# Patient Record
Sex: Female | Born: 1955 | Race: White | Hispanic: No | Marital: Single | Smoking: Former smoker
Health system: Southern US, Community
[De-identification: ages and names within clinical notes are randomized; demographics above are authoritative.]

## PROBLEM LIST (undated history)

## (undated) DIAGNOSIS — K859 Acute pancreatitis without necrosis or infection, unspecified: Secondary | ICD-10-CM

## (undated) DIAGNOSIS — I10 Essential (primary) hypertension: Secondary | ICD-10-CM

## (undated) DIAGNOSIS — M51369 Other intervertebral disc degeneration, lumbar region without mention of lumbar back pain or lower extremity pain: Secondary | ICD-10-CM

## (undated) DIAGNOSIS — M5136 Other intervertebral disc degeneration, lumbar region: Secondary | ICD-10-CM

## (undated) HISTORY — PX: CHOLECYSTECTOMY: SHX55

---

## 2004-09-18 ENCOUNTER — Ambulatory Visit: Payer: Self-pay | Admitting: Family Medicine

## 2004-12-12 ENCOUNTER — Emergency Department: Payer: Self-pay | Admitting: Emergency Medicine

## 2005-05-05 ENCOUNTER — Emergency Department: Payer: Self-pay | Admitting: Emergency Medicine

## 2006-01-10 ENCOUNTER — Emergency Department: Payer: Self-pay | Admitting: Emergency Medicine

## 2007-06-07 ENCOUNTER — Ambulatory Visit: Payer: Self-pay | Admitting: Internal Medicine

## 2007-06-09 ENCOUNTER — Emergency Department: Payer: Self-pay | Admitting: Emergency Medicine

## 2007-06-16 ENCOUNTER — Other Ambulatory Visit: Payer: Self-pay

## 2007-06-16 ENCOUNTER — Ambulatory Visit: Payer: Self-pay | Admitting: Emergency Medicine

## 2007-06-16 ENCOUNTER — Emergency Department: Payer: Self-pay | Admitting: Emergency Medicine

## 2007-07-16 ENCOUNTER — Ambulatory Visit: Payer: Self-pay | Admitting: Vascular Surgery

## 2007-12-31 ENCOUNTER — Emergency Department: Payer: Self-pay | Admitting: Emergency Medicine

## 2007-12-31 ENCOUNTER — Ambulatory Visit: Payer: Self-pay | Admitting: Family Medicine

## 2008-01-01 ENCOUNTER — Inpatient Hospital Stay: Payer: Self-pay | Admitting: Internal Medicine

## 2008-01-13 ENCOUNTER — Encounter: Payer: Self-pay | Admitting: Orthopaedic Surgery

## 2010-07-12 ENCOUNTER — Emergency Department: Payer: Self-pay | Admitting: Emergency Medicine

## 2011-10-18 ENCOUNTER — Emergency Department: Payer: Self-pay | Admitting: Internal Medicine

## 2012-06-16 IMAGING — CT CT HEAD WITHOUT CONTRAST
2 series · 16 of 30 positions shown, 20 images · non-contrast
Comparison: none

REASON FOR EXAM: seizure
COMMENTS:

PROCEDURE:     CT  - CT HEAD WITHOUT CONTRAST  - October 18, 2011  [DATE]
RESULT:     Comparison:  None
TECHNIQUE: Multiple axial images from the foramen magnum to the vertex were
obtained without IV contrast.

[Series 2: without · axial · non-contrast · 0.41mm/px · z∈[-219,-99]mm · 13 of 29 slices shown, 17 images]
[im 3/29  brain]
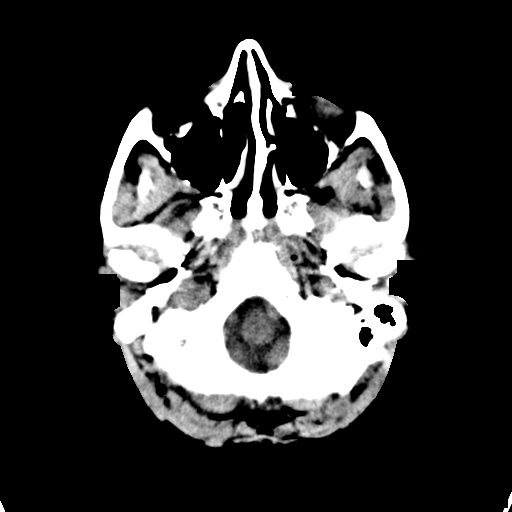
[im 3/29  bone]
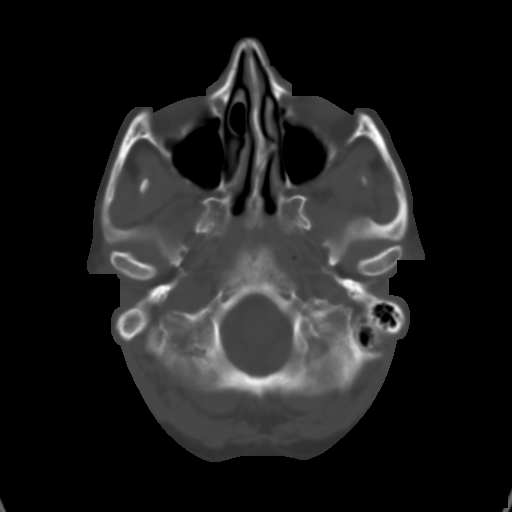
[im 5/29  brain]
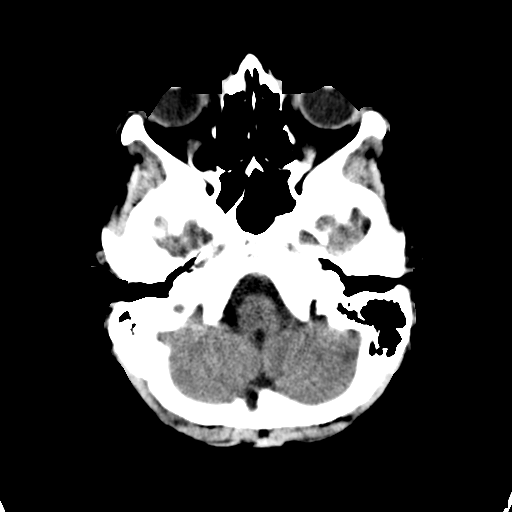
[im 7/29  brain]
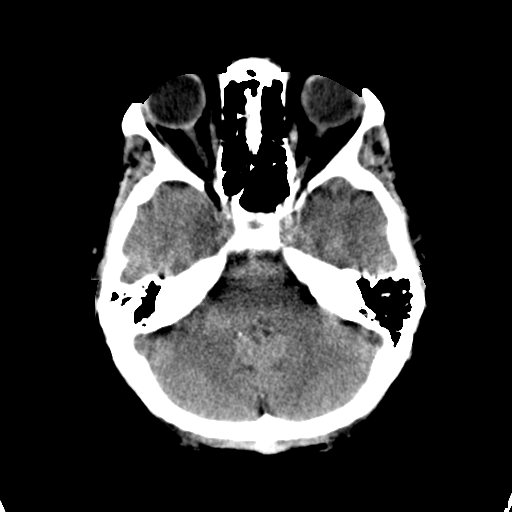
[im 9/29  brain]
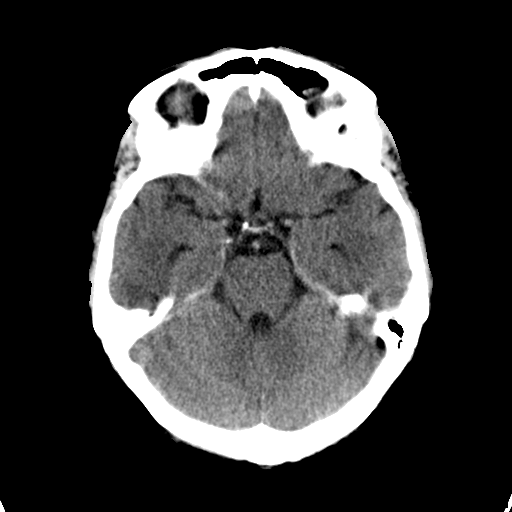
[im 11/29  brain]
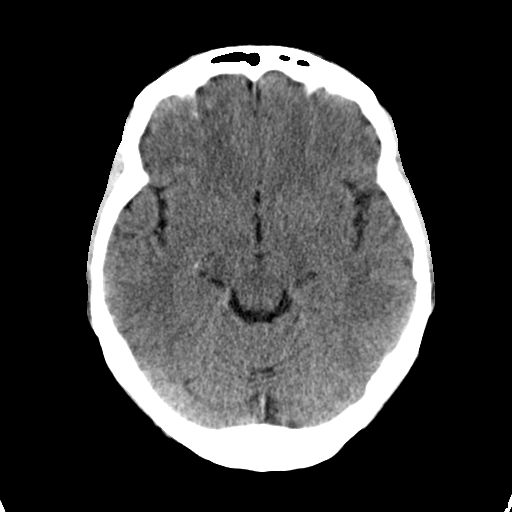
[im 11/29  bone]
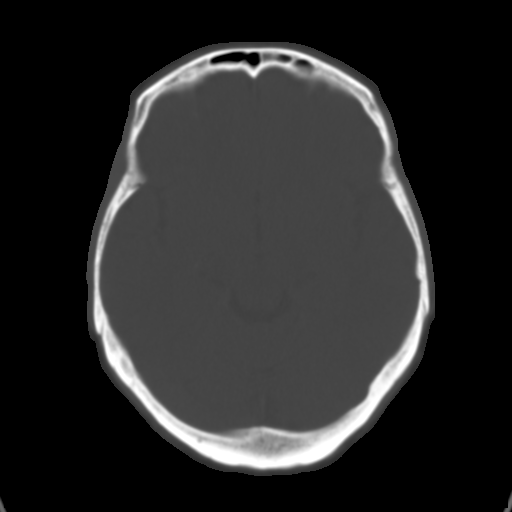
[im 13/29  brain]
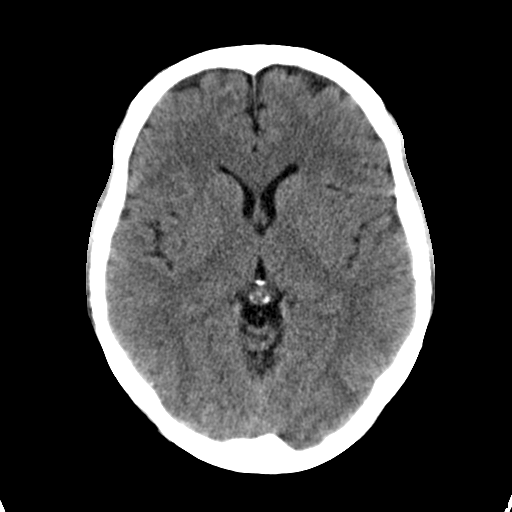
[im 15/29  brain]
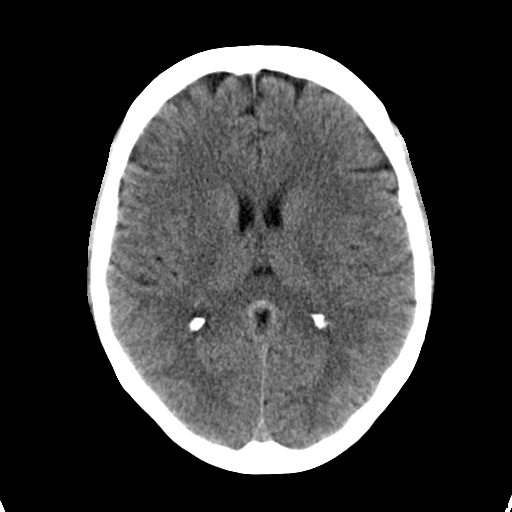
[im 17/29  brain]
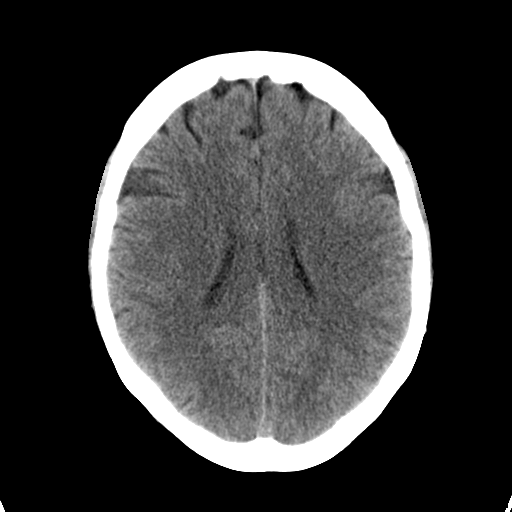
[im 19/29  brain]
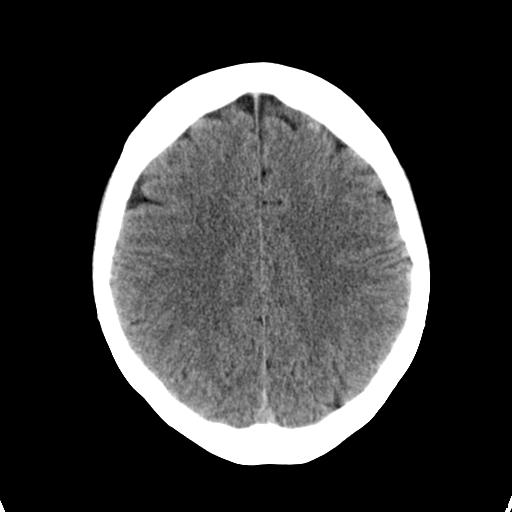
[im 19/29  bone]
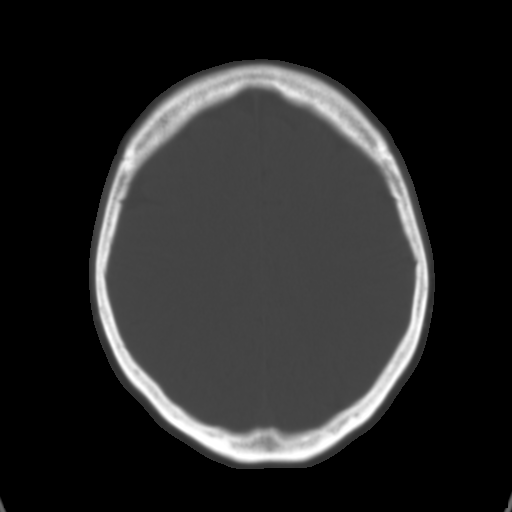
[im 21/29  brain]
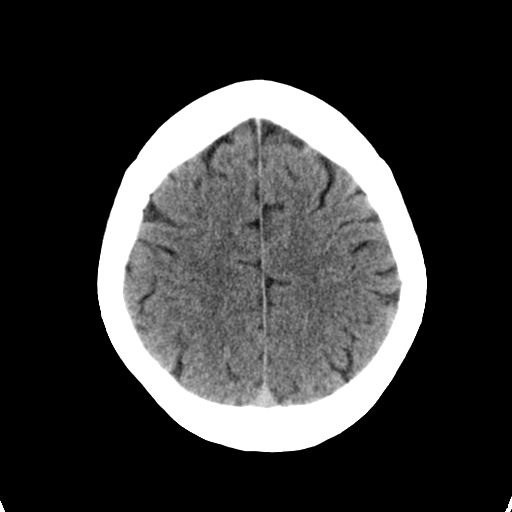
[im 23/29  brain]
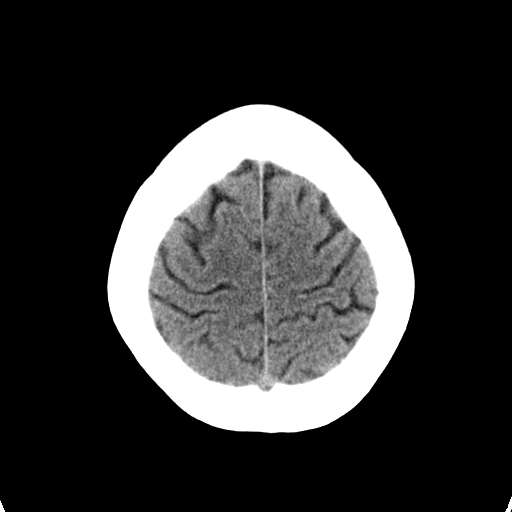
[im 25/29  brain]
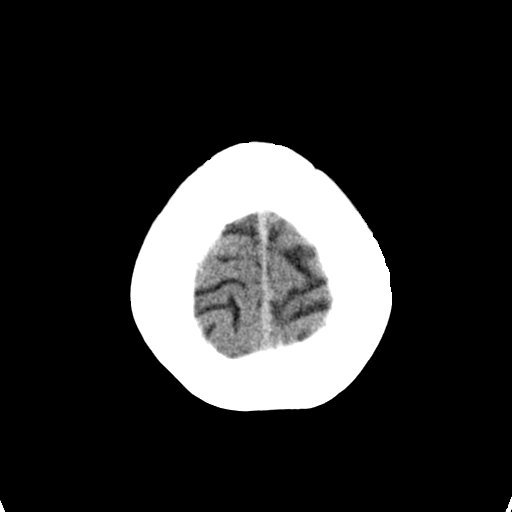
[im 27/29  brain]
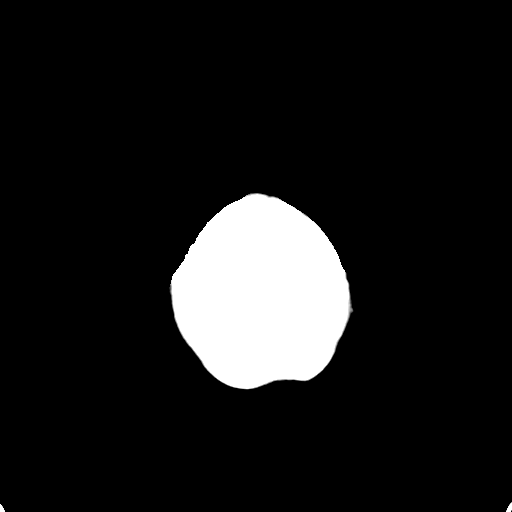
[im 27/29  bone]
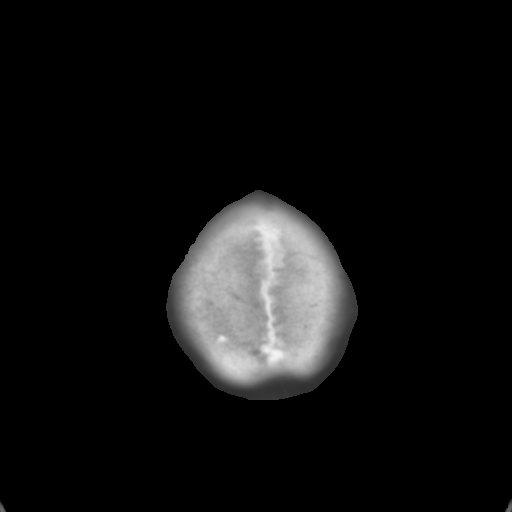

[Series 3: bone · axial · 0.41mm/px · z∈[-219,-179]mm · 3 of 29 slices shown]
[im 3/29  bone]
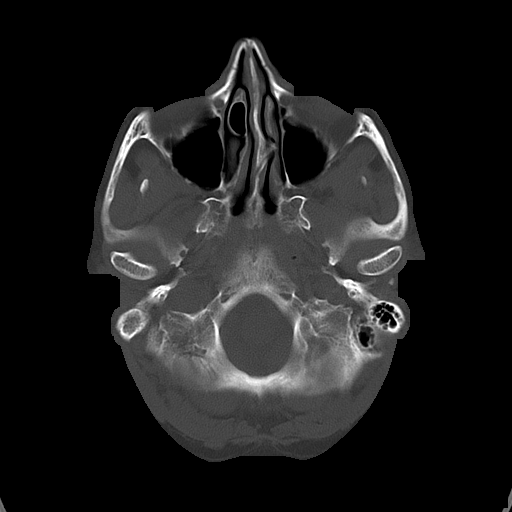
[im 7/29  bone]
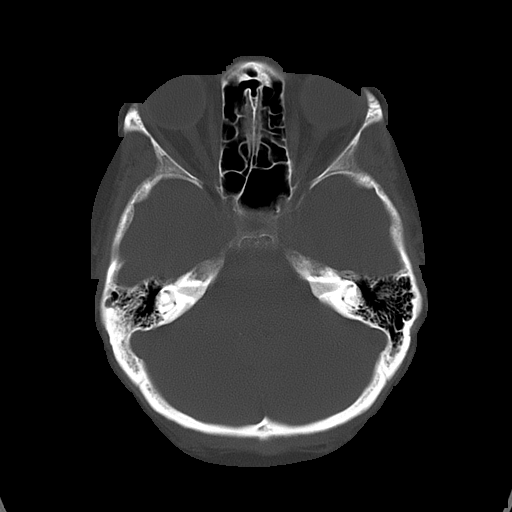
[im 11/29  bone]
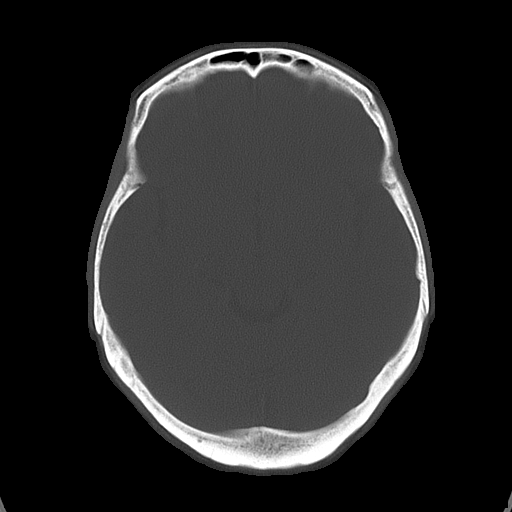

[16 of 30 positions shown; findings below may reference images not displayed]

FINDINGS: There is no evidence for mass effect, midline shift, or extra-axial fluid
collections. There is no evidence for space-occupying lesion, intracranial
hemorrhage, or cortical-based area of infarction.

There is a trace amount of fluid in the right mastoid air cells, which is
nonspecific.

The osseous structures are unremarkable.
IMPRESSION: No acute intracranial process.

## 2018-06-05 ENCOUNTER — Other Ambulatory Visit: Payer: Self-pay

## 2018-06-05 ENCOUNTER — Emergency Department
Admission: EM | Admit: 2018-06-05 | Discharge: 2018-06-05 | Disposition: A | Discharge status: Home / Self Care | Payer: Self-pay | Attending: Emergency Medicine | Admitting: Emergency Medicine

## 2018-06-05 ENCOUNTER — Encounter: Payer: Self-pay | Admitting: Intensive Care

## 2018-06-05 ENCOUNTER — Emergency Department: Payer: Self-pay

## 2018-06-05 DIAGNOSIS — I10 Essential (primary) hypertension: Secondary | ICD-10-CM | POA: Insufficient documentation

## 2018-06-05 DIAGNOSIS — R1010 Upper abdominal pain, unspecified: Secondary | ICD-10-CM

## 2018-06-05 DIAGNOSIS — Z87891 Personal history of nicotine dependence: Secondary | ICD-10-CM | POA: Insufficient documentation

## 2018-06-05 DIAGNOSIS — K5289 Other specified noninfective gastroenteritis and colitis: Secondary | ICD-10-CM | POA: Insufficient documentation

## 2018-06-05 DIAGNOSIS — Z760 Encounter for issue of repeat prescription: Secondary | ICD-10-CM | POA: Insufficient documentation

## 2018-06-05 DIAGNOSIS — K529 Noninfective gastroenteritis and colitis, unspecified: Secondary | ICD-10-CM

## 2018-06-05 HISTORY — DX: Other intervertebral disc degeneration, lumbar region: M51.36

## 2018-06-05 HISTORY — DX: Other intervertebral disc degeneration, lumbar region without mention of lumbar back pain or lower extremity pain: M51.369

## 2018-06-05 HISTORY — DX: Acute pancreatitis without necrosis or infection, unspecified: K85.90

## 2018-06-05 HISTORY — DX: Essential (primary) hypertension: I10

## 2018-06-05 LAB — BASIC METABOLIC PANEL
Anion gap: 9 (ref 5–15)
BUN: 11 mg/dL (ref 6–20)
CO2: 24 mmol/L (ref 22–32)
Calcium: 9.4 mg/dL (ref 8.9–10.3)
Chloride: 109 mmol/L (ref 98–111)
Creatinine, Ser: 0.67 mg/dL (ref 0.44–1.00)
GFR calc Af Amer: >60 mL/min (ref >60)
GLUCOSE: 117 mg/dL — AB (ref 70–99)
POTASSIUM: 3.5 mmol/L (ref 3.5–5.1)
SODIUM: 142 mmol/L (ref 135–145)

## 2018-06-05 LAB — HEPATIC FUNCTION PANEL
ALT: 18 U/L (ref 0–44)
AST: 23 U/L (ref 15–41)
Albumin: 4.6 g/dL (ref 3.5–5.0)
Alkaline Phosphatase: 94 U/L (ref 38–126)
Bilirubin, Direct: <0.1 mg/dL (ref 0.0–0.2)
TOTAL PROTEIN: 8.1 g/dL (ref 6.5–8.1)
Total Bilirubin: 0.9 mg/dL (ref 0.3–1.2)

## 2018-06-05 LAB — CBC
HEMATOCRIT: 43.9 % (ref 35.0–47.0)
Hemoglobin: 14.8 g/dL (ref 12.0–16.0)
MCH: 30.4 pg (ref 26.0–34.0)
MCHC: 33.7 g/dL (ref 32.0–36.0)
MCV: 90.2 fL (ref 80.0–100.0)
Platelets: 420 10*3/uL (ref 150–440)
RBC: 4.87 MIL/uL (ref 3.80–5.20)
RDW: 14.2 % (ref 11.5–14.5)
WBC: 13.2 10*3/uL — AB (ref 3.6–11.0)

## 2018-06-05 LAB — LIPASE, BLOOD: LIPASE: 24 U/L (ref 11–51)

## 2018-06-05 LAB — TROPONIN I: Troponin I: <0.03 ng/mL (ref <0.03)

## 2018-06-05 MED ORDER — ONDANSETRON 4 MG PO TBDP: 4.0000 mg | ORAL_TABLET | Freq: Three times a day (TID) | ORAL | 0 refills | Status: DC | PRN

## 2018-06-05 MED ORDER — ONDANSETRON 4 MG PO TBDP
4.0000 mg | ORAL_TABLET | Freq: Once | ORAL | Status: AC
  Administered 2018-06-05: 4 mg via ORAL
  Filled 2018-06-05: qty 1

## 2018-06-05 MED ORDER — AMLODIPINE BESYLATE 5 MG PO TABS: 5.0000 mg | ORAL_TABLET | Freq: Every day | ORAL | 11 refills | Status: DC

## 2018-06-05 MED ORDER — POTASSIUM CHLORIDE ER 10 MEQ PO TBCR: 10.0000 meq | EXTENDED_RELEASE_TABLET | Freq: Every day | ORAL | 1 refills | Status: DC

## 2018-06-05 MED ORDER — CYCLOBENZAPRINE HCL 10 MG PO TABS: 10.0000 mg | ORAL_TABLET | Freq: Three times a day (TID) | ORAL | 1 refills | Status: DC | PRN

## 2018-06-05 MED ORDER — OXYCODONE-ACETAMINOPHEN 5-325 MG PO TABS
2.0000 | ORAL_TABLET | Freq: Once | ORAL | Status: AC
  Administered 2018-06-05: 2 via ORAL
  Filled 2018-06-05: qty 2

## 2018-06-05 MED ORDER — FUROSEMIDE 20 MG PO TABS: 20.0000 mg | ORAL_TABLET | ORAL | 0 refills | Status: DC | PRN

## 2018-06-05 MED ORDER — PANTOPRAZOLE SODIUM 40 MG PO TBEC: 40.0000 mg | DELAYED_RELEASE_TABLET | Freq: Every day | ORAL | 1 refills | Status: DC

## 2018-06-05 NOTE — ED Triage Notes (Signed)
Pt in via EMS with c/o HTN. EMS reports pt has been off of her meds for 30 days. EMS reports pt also c/o pain to mid epigastric and describes it as the same as when she had pancreatitis.

## 2018-06-05 NOTE — ED Notes (Signed)
Pt reports that she is having upper abd pain - pain started 2-3 years ago - she wants to find out today "what is going on" - pt states that she has nausea and that when she is cold the pain is worse - she states once before she had pancreatitis and she wants worked up for that  Pt reports that she has been dx with HTN since 2003 and today it is worse because she went to her PCP in Feb. And her MD retired - the new provider would not prescribe her all of her medication and since she has not returned for follow up - she has been without medication for 30 days

## 2018-06-05 NOTE — ED Provider Notes (Signed)
[facility] Emergency Department Provider Note       Time seen: ----------------------------------------- 4:02 PM on 06/05/2018 -----------------------------------------   I have reviewed the triage vital signs and the nursing notes.  HISTORY   Chief Complaint Hypertension and Abdominal Pain    HPI Pamela Hanson is a 62 y.o. female with a history of hypertension and pancreatitis who presents to the ED for abdominal pain.  Patient reports having upper abdominal pain that started 2 to 3 years ago.  Acutely she has had vomiting and diarrhea over the past several days.  She wants to find out today was going on.  She states she has had nausea and when she is cold the pain is worse.  She also has run out of all of her medications because her doctor retired she is requesting a medication refill for her blood pressure.  Past Medical History:  Diagnosis Date  . Degenerative disc disease, lumbar   . Hypertension   . Pancreatitis     There are no active problems to display for this patient.   History reviewed. No pertinent surgical history.  Allergies Patient has no known allergies.  Social History Social History   Tobacco Use  . Smoking status: Former Smoker    Types: Cigarettes  . Smokeless tobacco: Never Used  Substance Use Topics  . Alcohol use: Yes    Comment: occ  . Drug use: Yes    Types: Marijuana   Review of Systems Constitutional: Negative for fever. Cardiovascular: Negative for chest pain. Respiratory: Negative for shortness of breath. Gastrointestinal: Positive for abdominal pain Genitourinary: Negative for dysuria. Musculoskeletal: Negative for back pain. Skin: Negative for rash. Neurological: Negative for headaches, focal weakness or numbness.  All systems negative/normal/unremarkable except as stated in the HPI  ____________________________________________   PHYSICAL EXAM:  VITAL SIGNS: ED Triage Vitals  Enc Vitals  Group     BP 06/05/18 1323 (!) 185/84     Pulse Rate 06/05/18 1323 87     Resp 06/05/18 1323 18     Temp 06/05/18 1323 99.3 F (37.4 C)     Temp Source 06/05/18 1323 Oral     SpO2 06/05/18 1323 94 %     Weight 06/05/18 1325 185 lb 10 oz (84.2 kg)     Height 06/05/18 1325 4' 11.5" (1.511 m)     Head Circumference --      Peak Flow --      Pain Score 06/05/18 1325 10     Pain Loc --      Pain Edu? --      Excl. in GC? --    Constitutional: Alert and oriented. Well appearing and in no distress. Eyes: Conjunctivae are normal. Normal extraocular movements. ENT   Head: Normocephalic and atraumatic.   Nose: No congestion/rhinnorhea.   Mouth/Throat: Mucous membranes are moist.   Neck: No stridor. Cardiovascular: Normal rate, regular rhythm. No murmurs, rubs, or gallops. Respiratory: Normal respiratory effort without tachypnea nor retractions. Breath sounds are clear and equal bilaterally. No wheezes/rales/rhonchi. Gastrointestinal: Epigastric tenderness, no rebound or guarding.  Normal bowel sounds. Musculoskeletal: Nontender with normal range of motion in extremities. No lower extremity tenderness nor edema. Neurologic:  Normal speech and language. No gross focal neurologic deficits are appreciated.  Skin:  Skin is warm, dry and intact. No rash noted. Psychiatric: Mood and affect are normal. Speech and behavior are normal.  ____________________________________________  EKG: Interpreted by me.  Sinus rhythm rate 73 bpm, normal PR interval,  normal QRS, normal QT  ____________________________________________  ED COURSE:  As part of my medical decision making, I reviewed the following data within the electronic MEDICAL RECORD NUMBER History obtained from family if available, nursing notes, old chart and ekg, as well as notes from prior ED visits. Patient presented for upper abdominal pain, we will assess with labs and imaging as indicated at this time.    Procedures ____________________________________________   LABS (pertinent positives/negatives)  Labs Reviewed  BASIC METABOLIC PANEL - Abnormal; Notable for the following components:      Result Value   Glucose, Bld 117 (*)    All other components within normal limits  CBC - Abnormal; Notable for the following components:   WBC 13.2 (*)    All other components within normal limits  TROPONIN I  LIPASE, BLOOD  HEPATIC FUNCTION PANEL  POC URINE PREG, ED    RADIOLOGY Images were viewed by me  Abdomen 2 view was unremarkable  ____________________________________________  DIFFERENTIAL DIAGNOSIS   GERD, peptic ulcer disease, constipation, gas pain, gastroenteritis  FINAL ASSESSMENT AND PLAN  Gastroenteritis, prescription refill   Plan: The patient had presented for acute on chronic pain with recent vomiting and diarrhea. Patient's labs did reveal a leukocytosis of uncertain significance. Patient's imaging was negative.  Should be discharged with antiemetics as well as antacids and blood pressure medication refills.  She is cleared for outpatient follow-up.   Ulice Dash, MD   Note: This note was generated in part or whole with voice recognition software. Voice recognition is usually quite accurate but there are transcription errors that can and very often do occur. I apologize for any typographical errors that were not detected and corrected.     Emily Filbert, MD 06/05/18 [phone]

## 2018-06-05 NOTE — ED Triage Notes (Signed)
Patient states "I have been out of b/p X30 days with headache and nausea. I also am having upper abdominal pain" HX pancreatitis. Ambulatory with no problems

## 2019-02-02 IMAGING — CR DG ABDOMEN 2V
1 series · 3 of 3 positions shown · non-contrast
Comparison: None.

CLINICAL DATA: Pt states she has had nausea and abdominal pain
right under her breasts. Hx HTN, cholecystectomy

EXAM:
ABDOMEN - 2 VIEW

[Series 3: t abdomen supine · 0.14mm/px · 3 of 3 slices shown]
[im 1/3]
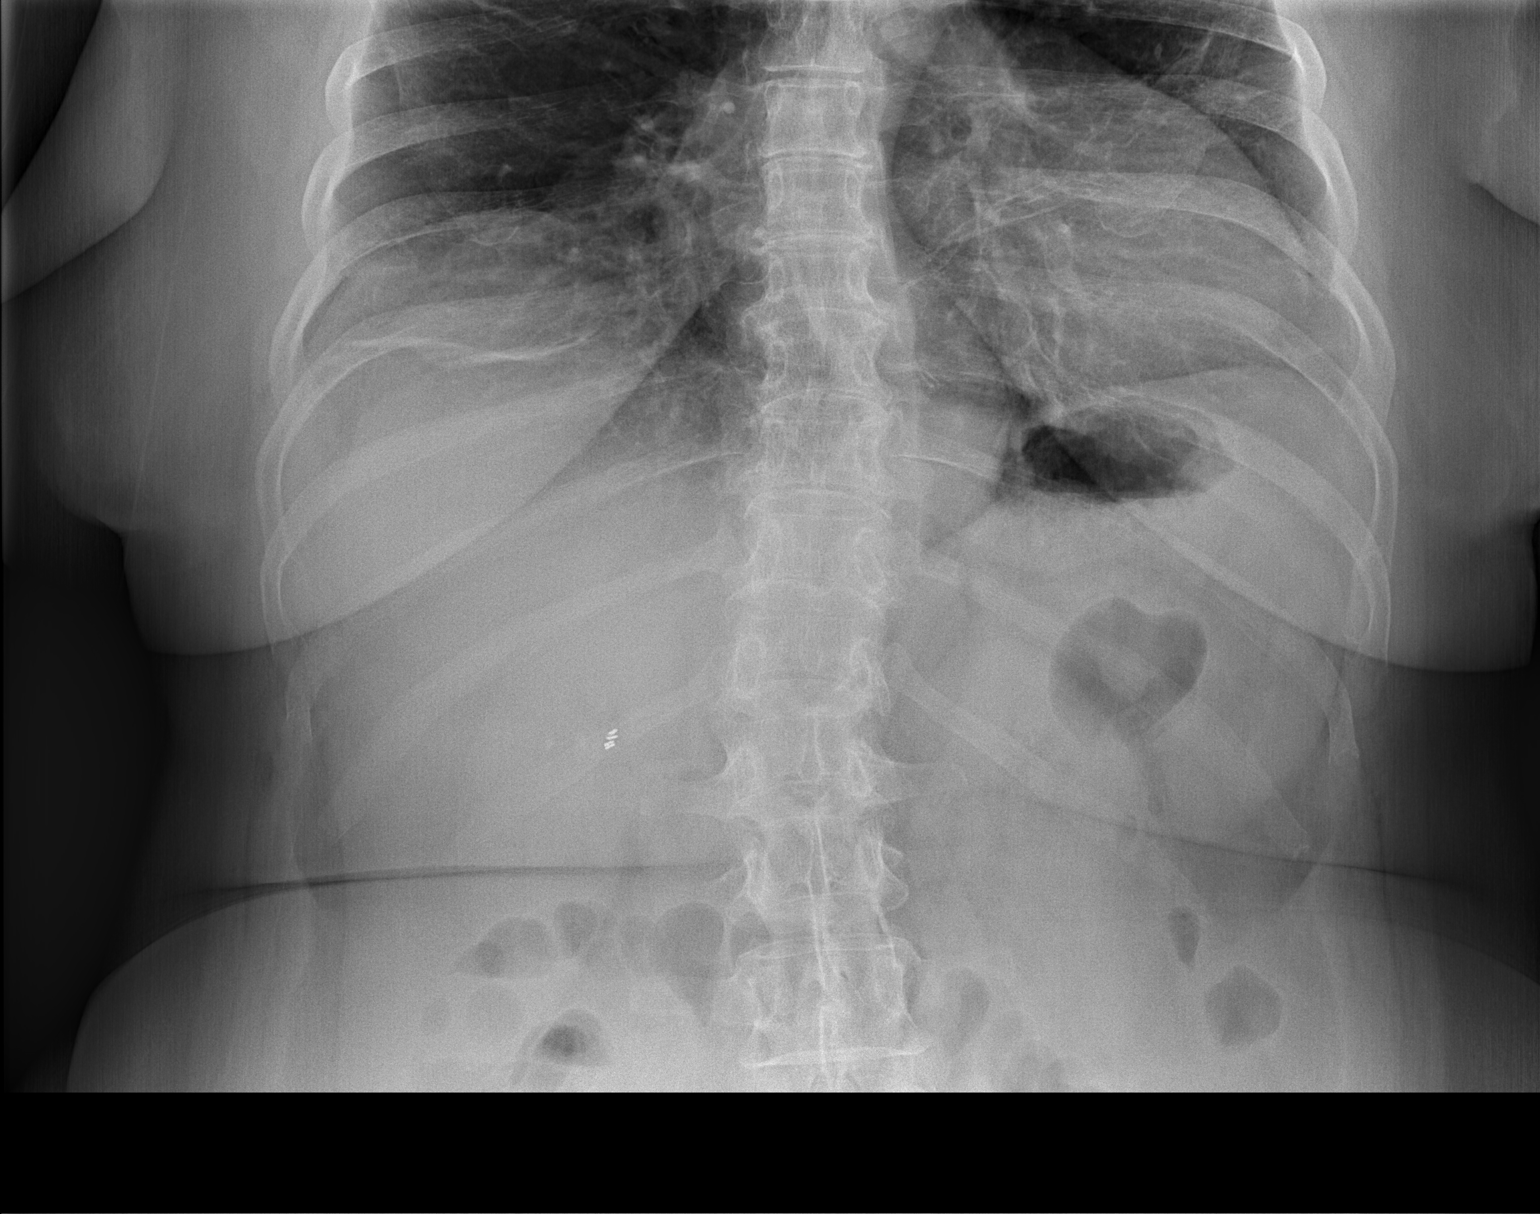
[im 2/3]
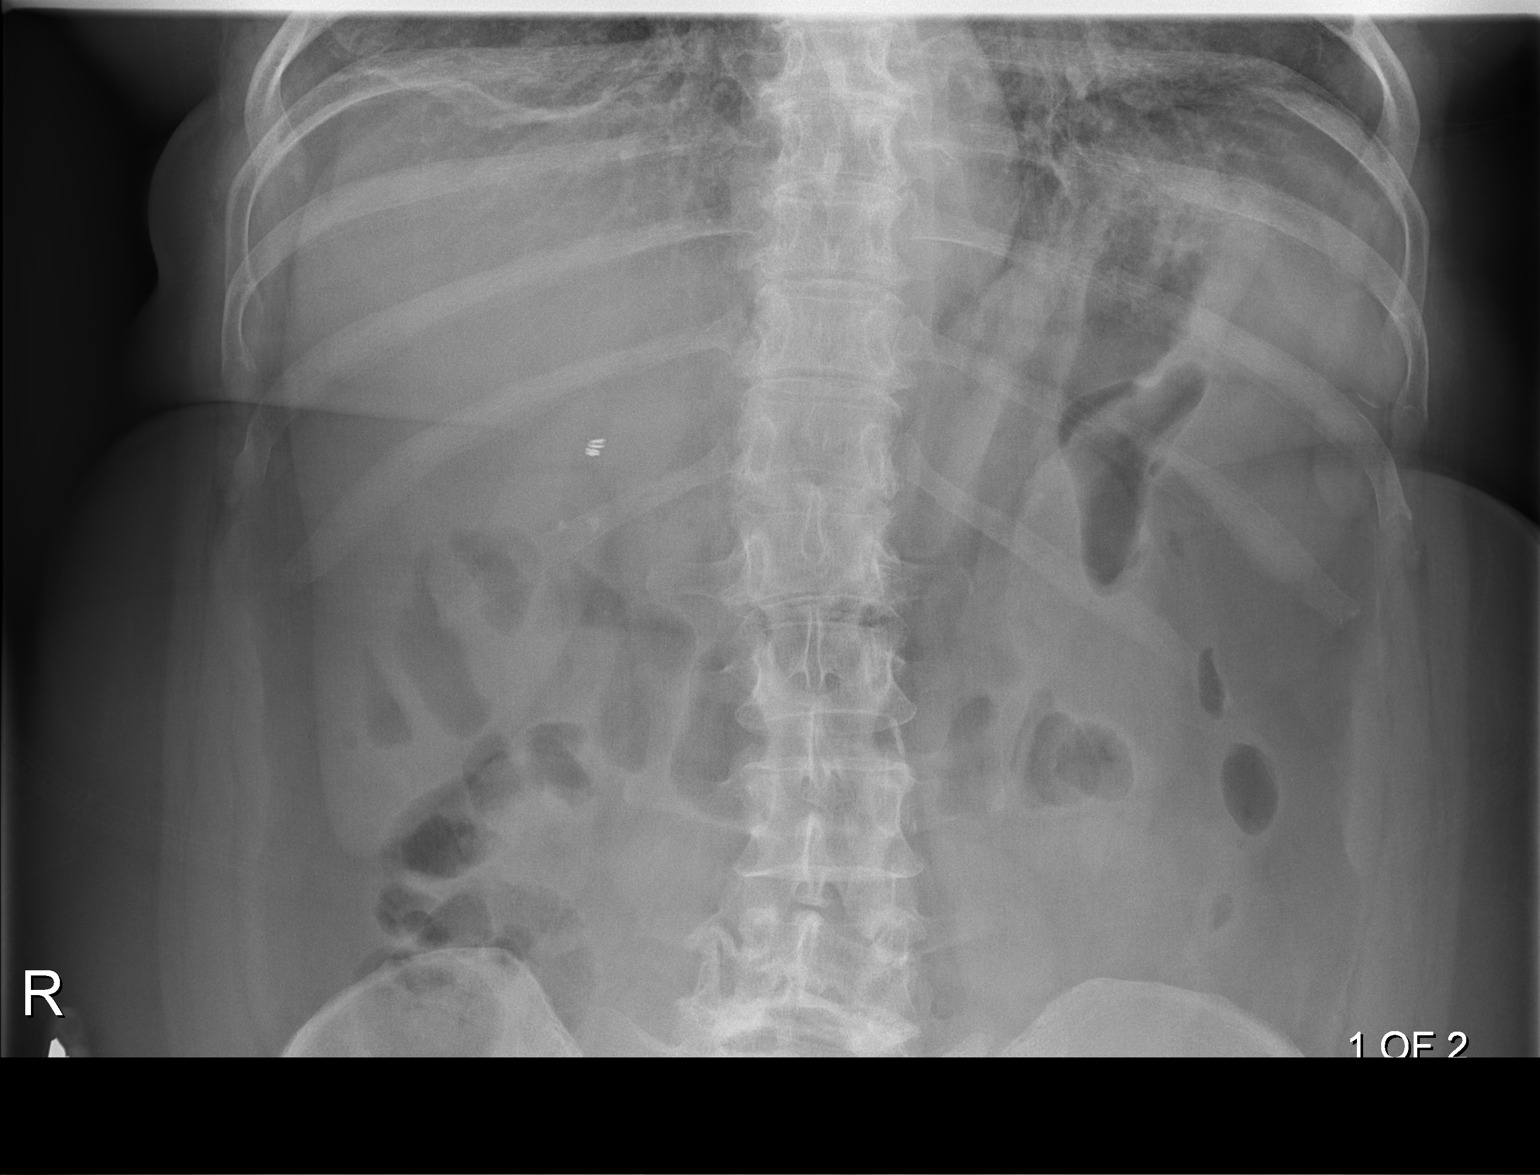
[im 3/3]
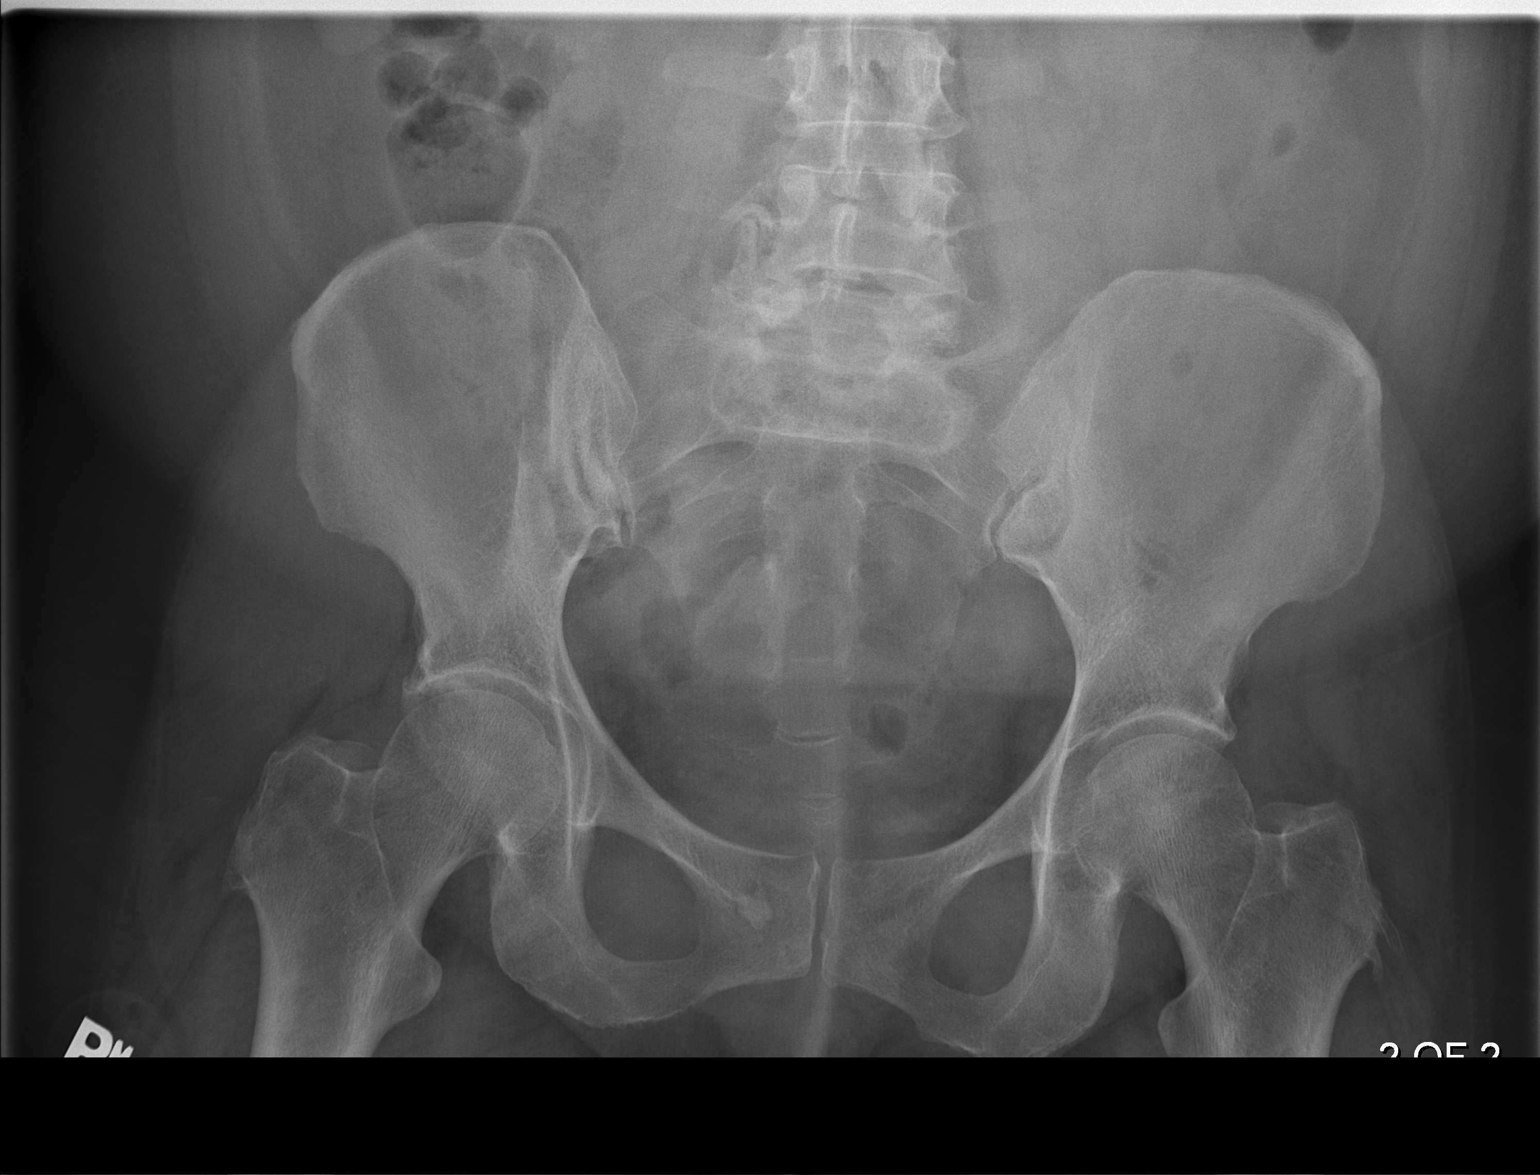

[3 of 3 positions shown; findings below may reference images not displayed]

FINDINGS: Bowel gas pattern is nonobstructive. No abnormal calcifications or
organomegaly. Surgical clips are present the RIGHT UPPER QUADRANT
the abdomen. No acute osseous abnormality.
IMPRESSION: No evidence for acute  abnormality.

## 2019-02-27 ENCOUNTER — Encounter: Payer: Self-pay | Admitting: Family Medicine

## 2019-02-27 ENCOUNTER — Ambulatory Visit: Payer: Self-pay | Admitting: Family Medicine

## 2019-02-27 ENCOUNTER — Other Ambulatory Visit: Payer: Self-pay

## 2019-02-27 VITALS — BP 148/84 | HR 93 | Temp 98.6°F | Resp 14 | Ht 59.0 in | Wt 174.4 lb

## 2019-02-27 DIAGNOSIS — Z599 Problem related to housing and economic circumstances, unspecified: Secondary | ICD-10-CM

## 2019-02-27 DIAGNOSIS — G8929 Other chronic pain: Secondary | ICD-10-CM

## 2019-02-27 DIAGNOSIS — M545 Low back pain, unspecified: Secondary | ICD-10-CM | POA: Insufficient documentation

## 2019-02-27 DIAGNOSIS — E6609 Other obesity due to excess calories: Secondary | ICD-10-CM | POA: Insufficient documentation

## 2019-02-27 DIAGNOSIS — Z13220 Encounter for screening for lipoid disorders: Secondary | ICD-10-CM

## 2019-02-27 DIAGNOSIS — Z598 Other problems related to housing and economic circumstances: Secondary | ICD-10-CM

## 2019-02-27 DIAGNOSIS — Z8639 Personal history of other endocrine, nutritional and metabolic disease: Secondary | ICD-10-CM | POA: Insufficient documentation

## 2019-02-27 DIAGNOSIS — I10 Essential (primary) hypertension: Secondary | ICD-10-CM

## 2019-02-27 DIAGNOSIS — Z6835 Body mass index (BMI) 35.0-35.9, adult: Secondary | ICD-10-CM

## 2019-02-27 MED ORDER — ETODOLAC 500 MG PO TABS: 500.0000 mg | ORAL_TABLET | Freq: Two times a day (BID) | ORAL | 2 refills | Status: DC | PRN

## 2019-02-27 MED ORDER — FUROSEMIDE 20 MG PO TABS: 20.0000 mg | ORAL_TABLET | ORAL | 0 refills | Status: DC | PRN

## 2019-02-27 MED ORDER — AMLODIPINE BESYLATE 10 MG PO TABS: 10.0000 mg | ORAL_TABLET | Freq: Every day | ORAL | 3 refills | Status: DC

## 2019-02-27 MED ORDER — CYCLOBENZAPRINE HCL 10 MG PO TABS: 10.0000 mg | ORAL_TABLET | Freq: Every day | ORAL | 2 refills | Status: DC | PRN

## 2019-02-27 MED ORDER — POTASSIUM CHLORIDE ER 10 MEQ PO TBCR: 10.0000 meq | EXTENDED_RELEASE_TABLET | Freq: Every day | ORAL | 1 refills | Status: DC

## 2019-02-27 NOTE — Patient Instructions (Signed)
DASH Eating Plan DASH stands for "Dietary Approaches to Stop Hypertension." The DASH eating plan is a healthy eating plan that has been shown to reduce high blood pressure (hypertension). It may also reduce your risk for type 2 diabetes, heart disease, and stroke. The DASH eating plan may also help with weight loss. What are tips for following this plan?  General guidelines  Avoid eating more than 2,300 mg (milligrams) of salt (sodium) a day. If you have hypertension, you may need to reduce your sodium intake to 1,500 mg a day.  Limit alcohol intake to no more than 1 drink a day for nonpregnant women and 2 drinks a day for men. One drink equals 12 oz of beer, 5 oz of wine, or 1 oz of hard liquor.  Work with your health care provider to maintain a healthy body weight or to lose weight. Ask what an ideal weight is for you.  Get at least 30 minutes of exercise that causes your heart to beat faster (aerobic exercise) most days of the week. Activities may include walking, swimming, or biking.  Work with your health care provider or diet and nutrition specialist (dietitian) to adjust your eating plan to your individual calorie needs. Reading food labels   Check food labels for the amount of sodium per serving. Choose foods with less than 5 percent of the Daily Value of sodium. Generally, foods with less than 300 mg of sodium per serving fit into this eating plan.  To find whole grains, look for the word "whole" as the first word in the ingredient list. Shopping  Buy products labeled as "low-sodium" or "no salt added."  Buy fresh foods. Avoid canned foods and premade or frozen meals. Cooking  Avoid adding salt when cooking. Use salt-free seasonings or herbs instead of table salt or sea salt. Check with your health care provider or pharmacist before using salt substitutes.  Do not fry foods. Cook foods using healthy methods such as baking, boiling, grilling, and broiling instead.  Cook with  heart-healthy oils, such as olive, canola, soybean, or sunflower oil. Meal planning  Eat a balanced diet that includes: ? 5 or more servings of fruits and vegetables each day. At each meal, try to fill half of your plate with fruits and vegetables. ? Up to 6-8 servings of whole grains each day. ? Less than 6 oz of lean meat, poultry, or fish each day. A 3-oz serving of meat is about the same size as a deck of cards. One egg equals 1 oz. ? 2 servings of low-fat dairy each day. ? A serving of nuts, seeds, or beans 5 times each week. ? Heart-healthy fats. Healthy fats called Omega-3 fatty acids are found in foods such as flaxseeds and coldwater fish, like sardines, salmon, and mackerel.  Limit how much you eat of the following: ? Canned or prepackaged foods. ? Food that is high in trans fat, such as fried foods. ? Food that is high in saturated fat, such as fatty meat. ? Sweets, desserts, sugary drinks, and other foods with added sugar. ? Full-fat dairy products.  Do not salt foods before eating.  Try to eat at least 2 vegetarian meals each week.  Eat more home-cooked food and less restaurant, buffet, and fast food.  When eating at a restaurant, ask that your food be prepared with less salt or no salt, if possible. What foods are recommended? The items listed may not be a complete list. Talk with your dietitian about   what dietary choices are best for you. Grains Whole-grain or whole-wheat bread. Whole-grain or whole-wheat pasta. Brown rice. Oatmeal. Quinoa. Bulgur. Whole-grain and low-sodium cereals. Pita bread. Low-fat, low-sodium crackers. Whole-wheat flour tortillas. Vegetables Fresh or frozen vegetables (raw, steamed, roasted, or grilled). Low-sodium or reduced-sodium tomato and vegetable juice. Low-sodium or reduced-sodium tomato sauce and tomato paste. Low-sodium or reduced-sodium canned vegetables. Fruits All fresh, dried, or frozen fruit. Canned fruit in natural juice (without  added sugar). Meat and other protein foods Skinless chicken or turkey. Ground chicken or turkey. Pork with fat trimmed off. Fish and seafood. Egg whites. Dried beans, peas, or lentils. Unsalted nuts, nut butters, and seeds. Unsalted canned beans. Lean cuts of beef with fat trimmed off. Low-sodium, lean deli meat. Dairy Low-fat (1%) or fat-free (skim) milk. Fat-free, low-fat, or reduced-fat cheeses. Nonfat, low-sodium ricotta or cottage cheese. Low-fat or nonfat yogurt. Low-fat, low-sodium cheese. Fats and oils Soft margarine without trans fats. Vegetable oil. Low-fat, reduced-fat, or light mayonnaise and salad dressings (reduced-sodium). Canola, safflower, olive, soybean, and sunflower oils. Avocado. Seasoning and other foods Herbs. Spices. Seasoning mixes without salt. Unsalted popcorn and pretzels. Fat-free sweets. What foods are not recommended? The items listed may not be a complete list. Talk with your dietitian about what dietary choices are best for you. Grains Baked goods made with fat, such as croissants, muffins, or some breads. Dry pasta or rice meal packs. Vegetables Creamed or fried vegetables. Vegetables in a cheese sauce. Regular canned vegetables (not low-sodium or reduced-sodium). Regular canned tomato sauce and paste (not low-sodium or reduced-sodium). Regular tomato and vegetable juice (not low-sodium or reduced-sodium). Pickles. Olives. Fruits Canned fruit in a light or heavy syrup. Fried fruit. Fruit in cream or butter sauce. Meat and other protein foods Fatty cuts of meat. Ribs. Fried meat. Bacon. Sausage. Bologna and other processed lunch meats. Salami. Fatback. Hotdogs. Bratwurst. Salted nuts and seeds. Canned beans with added salt. Canned or smoked fish. Whole eggs or egg yolks. Chicken or turkey with skin. Dairy Whole or 2% milk, cream, and half-and-half. Whole or full-fat cream cheese. Whole-fat or sweetened yogurt. Full-fat cheese. Nondairy creamers. Whipped toppings.  Processed cheese and cheese spreads. Fats and oils Butter. Stick margarine. Lard. Shortening. Ghee. Bacon fat. Tropical oils, such as coconut, palm kernel, or palm oil. Seasoning and other foods Salted popcorn and pretzels. Onion salt, garlic salt, seasoned salt, table salt, and sea salt. Worcestershire sauce. Tartar sauce. Barbecue sauce. Teriyaki sauce. Soy sauce, including reduced-sodium. Steak sauce. Canned and packaged gravies. Fish sauce. Oyster sauce. Cocktail sauce. Horseradish that you find on the shelf. Ketchup. Mustard. Meat flavorings and tenderizers. Bouillon cubes. Hot sauce and Tabasco sauce. Premade or packaged marinades. Premade or packaged taco seasonings. Relishes. Regular salad dressings. Where to find more information:  National Heart, Lung, and Blood Institute: www.nhlbi.nih.gov  American Heart Association: www.heart.org Summary  The DASH eating plan is a healthy eating plan that has been shown to reduce high blood pressure (hypertension). It may also reduce your risk for type 2 diabetes, heart disease, and stroke.  With the DASH eating plan, you should limit salt (sodium) intake to 2,300 mg a day. If you have hypertension, you may need to reduce your sodium intake to 1,500 mg a day.  When on the DASH eating plan, aim to eat more fresh fruits and vegetables, whole grains, lean proteins, low-fat dairy, and heart-healthy fats.  Work with your health care provider or diet and nutrition specialist (dietitian) to adjust your eating plan to your   individual calorie needs. This information is not intended to replace advice given to you by your health care provider. Make sure you discuss any questions you have with your health care provider. Document Released: 10/04/2011 Document Revised: 10/08/2016 Document Reviewed: 10/08/2016 Elsevier Interactive Patient Education  2019 [employer] Fat and Cholesterol Restricted Eating Plan Getting too much fat and cholesterol in your diet  may cause health problems. Choosing the right foods helps keep your fat and cholesterol at normal levels. This can keep you from getting certain diseases. Your doctor may recommend an eating plan that includes:  Total fat: ______% or less of total calories a day.  Saturated fat: ______% or less of total calories a day.  Cholesterol: less than _________mg a day.  Fiber: ______g a day. What are tips for following this plan? Meal planning  At meals, divide your plate into four equal parts: ? Fill one-half of your plate with vegetables and green salads. ? Fill one-fourth of your plate with whole grains. ? Fill one-fourth of your plate with low-fat (lean) protein foods.  Eat fish that is high in omega-3 fats at least two times a week. This includes mackerel, tuna, sardines, and salmon.  Eat foods that are high in fiber, such as whole grains, beans, apples, broccoli, carrots, peas, and barley. General tips   Work with your doctor to lose weight if you need to.  Avoid: ? Foods with added sugar. ? Fried foods. ? Foods with partially hydrogenated oils.  Limit alcohol intake to no more than 1 drink a day for nonpregnant women and 2 drinks a day for men. One drink equals 12 oz of beer, 5 oz of wine, or 1 oz of hard liquor. Reading food labels  Check food labels for: ? Trans fats. ? Partially hydrogenated oils. ? Saturated fat (g) in each serving. ? Cholesterol (mg) in each serving. ? Fiber (g) in each serving.  Choose foods with healthy fats, such as: ? Monounsaturated fats. ? Polyunsaturated fats. ? Omega-3 fats.  Choose grain products that have whole grains. Look for the word "whole" as the first word in the ingredient list. Cooking  Cook foods using low-fat methods. These include baking, boiling, grilling, and broiling.  Eat more home-cooked foods. Eat at restaurants and buffets less often.  Avoid cooking using saturated fats, such as butter, cream, palm oil, palm kernel  oil, and coconut oil. Recommended foods  Fruits  All fresh, canned (in natural juice), or frozen fruits. Vegetables  Fresh or frozen vegetables (raw, steamed, roasted, or grilled). Green salads. Grains  Whole grains, such as whole wheat or whole grain breads, crackers, cereals, and pasta. Unsweetened oatmeal, bulgur, barley, quinoa, or brown rice. Corn or whole wheat flour tortillas. Meats and other protein foods  Ground beef (85% or leaner), grass-fed beef, or beef trimmed of fat. Skinless chicken or turkey. Ground chicken or turkey. Pork trimmed of fat. All fish and seafood. Egg whites. Dried beans, peas, or lentils. Unsalted nuts or seeds. Unsalted canned beans. Nut butters without added sugar or oil. Dairy  Low-fat or nonfat dairy products, such as skim or 1% milk, 2% or reduced-fat cheeses, low-fat and fat-free ricotta or cottage cheese, or plain low-fat and nonfat yogurt. Fats and oils  Tub margarine without trans fats. Light or reduced-fat mayonnaise and salad dressings. Avocado. Olive, canola, sesame, or safflower oils. The items listed above may not be a complete list of foods and beverages you can eat. Contact a dietitian for more information. Foods   to avoid Fruits  Canned fruit in heavy syrup. Fruit in cream or butter sauce. Fried fruit. Vegetables  Vegetables cooked in cheese, cream, or butter sauce. Fried vegetables. Grains  White bread. White pasta. White rice. Cornbread. Bagels, pastries, and croissants. Crackers and snack foods that contain trans fat and hydrogenated oils. Meats and other protein foods  Fatty cuts of meat. Ribs, chicken wings, bacon, sausage, bologna, salami, chitterlings, fatback, hot dogs, bratwurst, and packaged lunch meats. Liver and organ meats. Whole eggs and egg yolks. Chicken and turkey with skin. Fried meat. Dairy  Whole or 2% milk, cream, half-and-half, and cream cheese. Whole milk cheeses. Whole-fat or sweetened yogurt. Full-fat  cheeses. Nondairy creamers and whipped toppings. Processed cheese, cheese spreads, and cheese curds. Beverages  Alcohol. Sugar-sweetened drinks such as sodas, lemonade, and fruit drinks. Fats and oils  Butter, stick margarine, lard, shortening, ghee, or bacon fat. Coconut, palm kernel, and palm oils. Sweets and desserts  Corn syrup, sugars, honey, and molasses. Candy. Jam and jelly. Syrup. Sweetened cereals. Cookies, pies, cakes, donuts, muffins, and ice cream. The items listed above may not be a complete list of foods and beverages you should avoid. Contact a dietitian for more information. Summary  Choosing the right foods helps keep your fat and cholesterol at normal levels. This can keep you from getting certain diseases.  At meals, fill one-half of your plate with vegetables and green salads.  Eat high-fiber foods, like whole grains, beans, apples, carrots, peas, and barley.  Limit added sugar, saturated fats, alcohol, and fried foods. This information is not intended to replace advice given to you by your health care provider. Make sure you discuss any questions you have with your health care provider. Document Released: 04/15/2012 Document Revised: 06/18/2018 Document Reviewed: 07/02/2017 Elsevier Interactive Patient Education  2019 [employer]  

## 2019-02-27 NOTE — Progress Notes (Signed)
Name: Pamela Hanson   MRN: [id number]    DOB: September 25, 1956   Date:02/27/2019       Progress Note  Subjective  Chief Complaint  Chief Complaint  Patient presents with  . Establish Care  . Hypertension  . Back Pain    degenerative disc disease    HPI  Pt presents to establish care and for the following concerns:  HTN:  -does take medications as prescribed - current regimen includes amlodipine  daily, but has been out for several days (was taking 2 of her  tablets).  She has been on potassium tablets with her Lasix for some time - takes . - She was on HCTZ in the past, but developed pancreatitis and was taken off of this.  - taking medications as instructed, no medication side effects noted, no TIAs, no chest pain on exertion, no dyspnea on exertion, notes swelling of ankles bilaterally (helped with elevation and lasix), no orthostatic dizziness or lightheadedness - DASH diet discussed - pt does not follow a low sodium diet; Salt added in cooking only.  Back Pain  Has had for many years, has had bulging discs (states "My L5 blew"), and was seeing a spine specialist in [place] for several years for "nerve root" injections.  She has intermittent LEFT leg weakness, numbness and tingling.  We will send to spine specialty today.  Taking Lodine BID for pain control (other NSAIDS cause GI upset), Flexeril PRN.  She talked at length about her options, and we will refill Liodine  Obesity: Body mass index is 35.22 kg/m. Weight Management History: Diet: Deep fries foods often; does not drink sweetened beverages Exercise:  not active - due to back pain. Co-Morbid Conditions: hypertension; 2 or more of these conditions combined with BMI >30 is considered morbid obesity; is this diagnosis appropriate and/or added to patient's problem list? No    There are no active problems to display for this patient.   No past surgical history on file.  No family history on file.  Social  History   Socioeconomic History  . Marital status: Single    Spouse name: Not on file  . Number of children: 1  . Years of education: Not on file  . Highest education level: Not on file  Occupational History  . Not on file  Social Needs  . Financial resource strain: Not hard at all  . Food insecurity:    Worry: Never true    Inability: Never true  . Transportation needs:    Medical: No    Non-medical: No  Tobacco Use  . Smoking status: Former Smoker    Types: Cigarettes  . Smokeless tobacco: Never Used  Substance and Sexual Activity  . Alcohol use: Yes    Comment: occ  . Drug use: Yes    Types: Marijuana  . Sexual activity: Not Currently  Lifestyle  . Physical activity:    Days per week: 0 days    Minutes per session: 0 min  . Stress: Not at all  Relationships  . Social connections:    Talks on phone: More than three times a week    Gets together: More than three times a week    Attends religious service: Never    Active member of club or organization: No    Attends meetings of clubs or organizations: Never    Relationship status: Never married  . Intimate partner violence:    Fear of current or ex partner: No  Emotionally abused: No    Physically abused: No    Forced sexual activity: No  Other Topics Concern  . Not on file  Social History Narrative  . Not on file     Current Outpatient Medications:  .  amLODipine (NORVASC) 5 MG tablet, Take 1 tablet (5 mg total) by mouth daily., Disp: 30 tablet, Rfl: 11 .  cyclobenzaprine (FLEXERIL) 10 MG tablet, Take 1 tablet (10 mg total) by mouth 3 (three) times daily as needed for muscle spasms., Disp: 30 tablet, Rfl: 1 .  etodolac (LODINE) 500 MG tablet, Take 500 mg by mouth 2 (two) times daily., Disp: , Rfl:  .  furosemide (LASIX) 20 MG tablet, Take 1 tablet (20 mg total) by mouth as needed., Disp: 30 tablet, Rfl: 0 .  ondansetron (ZOFRAN ODT) 4 MG disintegrating tablet, Take 1 tablet (4 mg total) by mouth every 8  (eight) hours as needed for nausea or vomiting. (Patient not taking: Reported on 02/27/2019), Disp: 20 tablet, Rfl: 0 .  pantoprazole (PROTONIX) 40 MG tablet, Take 1 tablet (40 mg total) by mouth daily. (Patient not taking: Reported on 02/27/2019), Disp: 30 tablet, Rfl: 1 .  potassium chloride (K-DUR) 10 MEQ tablet, Take 1 tablet (10 mEq total) by mouth daily. (Patient not taking: Reported on 02/27/2019), Disp: 30 tablet, Rfl: 1  No Known Allergies  I personally reviewed active problem list, medication list, allergies, family history, social history, health maintenance, lab results with the patient/caregiver today.   ROS  Constitutional: Negative for fever or weight change.  Respiratory: Negative for cough and shortness of breath.   Cardiovascular: Negative for chest pain or palpitations.  Gastrointestinal: Negative for abdominal pain, no bowel changes.  Musculoskeletal: Negative for gait problem or joint swelling.  Skin: Negative for rash.  Neurological: Negative for dizziness or headache.  No other specific complaints in a complete review of systems (except as listed in HPI above).  Objective  Vitals:   02/27/19 0944  BP: (!) 148/84  Pulse: 93  Resp: 14  Temp: 98.6 F (37 C)  TempSrc: Oral  SpO2: 97%  Weight: 174 lb 6.4 oz (79.1 kg)  Height: 4\' 11"  (1.499 m)   Body mass index is 35.22 kg/m.  Physical Exam  Constitutional: Patient appears well-developed and well-nourished. No distress.  HENT: Head: Normocephalic and atraumatic. Eyes: Conjunctivae and EOM are normal. No scleral icterus.  Pupils are equal, round, and reactive to light.  Neck: Normal range of motion. Neck supple. No JVD present. No thyromegaly present.  Cardiovascular: Normal rate, regular rhythm and normal heart sounds.  No murmur heard. No BLE edema. Pulmonary/Chest: Effort normal and breath sounds normal. No respiratory distress. Musculoskeletal: Normal range of motion, no joint effusions. No gross deformities  Neurological: Pt is alert and oriented to person, place, and time. No cranial nerve deficit. Coordination, balance, strength, speech and gait are normal.  Skin: Skin is warm and dry. No rash noted. No erythema.  Psychiatric: Patient has a normal mood and affect. behavior is normal. Judgment and thought content normal.  No results found for this or any previous visit (from the past 72 hour(s)).  PHQ2/9: Depression screen PHQ 2/9 02/27/2019  Decreased Interest 0  Down, Depressed, Hopeless 0  PHQ - 2 Score 0  Altered sleeping 0  Tired, decreased energy 0  Change in appetite 0  Feeling bad or failure about yourself  0  Trouble concentrating 0  Moving slowly or fidgety/restless 0  Suicidal thoughts 0  PHQ-9 Score  0  Difficult doing work/chores Not difficult at all   PHQ-2/9 Result is negative.    Fall Risk: Fall Risk  02/27/2019  Falls in the past year? 0  Number falls in past yr: 0  Injury with Fall? 0  Follow up Falls evaluation completed   Assessment & Plan  1. Essential hypertension - DASH diet discussed - amLODipine (NORVASC) 10 MG tablet; Take 1 tablet (10 mg total) by mouth daily.  Dispense: 90 tablet; Refill: 3 - furosemide (LASIX) 20 MG tablet; Take 1 tablet (20 mg total) by mouth as needed.  Dispense: 90 tablet; Refill: 0 - potassium chloride (K-DUR) 10 MEQ tablet; Take 1 tablet (10 mEq total) by mouth daily.  Dispense: 90 tablet; Refill: 1 - COMPLETE METABOLIC PANEL WITH GFR - Ambulatory referral to Chronic Care Management Services  2. Class 2 severe obesity due to excess calories with serious comorbidity and body mass index (BMI) of 35.0 to 35.9 in adult [facility]) - Discussed importance of 150 minutes of physical activity weekly, eat two servings of fish weekly, eat one serving of tree nuts ( cashews, pistachios, pecans, almonds.Marland Kitchen) every other day, eat 6 servings of fruit/vegetables daily and drink plenty of water and avoid sweet beverages.  - Ambulatory referral to Chronic  Care Management Services  3. Chronic bilateral low back pain without sciatica - Ambulatory referral to Chronic Care Management Services - Ambulatory referral to Spine Surgery - cyclobenzaprine (FLEXERIL) 10 MG tablet; Take 1 tablet (10 mg total) by mouth daily as needed for muscle spasms.  Dispense: 30 tablet; Refill: 2 - etodolac (LODINE) 500 MG tablet; Take 1 tablet (500 mg total) by mouth 2 (two) times daily as needed (for pain).  Dispense: 60 tablet; Refill: 2  4. History of hypokalemia - CMP today; may continue potassium when taking lasix   5. Lipid screening - Lipid panel  6. Financial difficulty - Ambulatory referral to Chronic Care Management Services

## 2019-02-28 LAB — COMPLETE METABOLIC PANEL WITH GFR
AG Ratio: 1.7 (calc) (ref 1.0–2.5)
ALT: 15 U/L (ref 6–29)
AST: 20 U/L (ref 10–35)
Albumin: 4.9 g/dL (ref 3.6–5.1)
Alkaline phosphatase (APISO): 93 U/L (ref 37–153)
BUN: 15 mg/dL (ref 7–25)
CO2: 30 mmol/L (ref 20–32)
Calcium: 9.9 mg/dL (ref 8.6–10.4)
Chloride: 105 mmol/L (ref 98–110)
Creat: 0.87 mg/dL (ref 0.50–0.99)
GFR, Est African American: 84 mL/min/{1.73_m2} (ref >=60)
GFR, Est Non African American: 72 mL/min/{1.73_m2} (ref >=60)
Globulin: 2.9 g/dL (calc) (ref 1.9–3.7)
Glucose, Bld: 105 mg/dL — ABNORMAL HIGH (ref 65–99)
Potassium: 3.2 mmol/L — ABNORMAL LOW (ref 3.5–5.3)
Sodium: 143 mmol/L (ref 135–146)
Total Bilirubin: 0.6 mg/dL (ref 0.2–1.2)
Total Protein: 7.8 g/dL (ref 6.1–8.1)

## 2019-02-28 LAB — LIPID PANEL
Cholesterol: 259 mg/dL — ABNORMAL HIGH (ref <200)
HDL: 64 mg/dL (ref >=50)
LDL Cholesterol (Calc): 167 mg/dL (calc) — ABNORMAL HIGH
Non-HDL Cholesterol (Calc): 195 mg/dL (calc) — ABNORMAL HIGH (ref <130)
Total CHOL/HDL Ratio: 4 (calc) (ref <5.0)
Triglycerides: 140 mg/dL (ref <150)

## 2019-03-02 ENCOUNTER — Other Ambulatory Visit: Payer: Self-pay | Admitting: Family Medicine

## 2019-03-02 DIAGNOSIS — R7301 Impaired fasting glucose: Secondary | ICD-10-CM

## 2019-03-02 DIAGNOSIS — E7800 Pure hypercholesterolemia, unspecified: Secondary | ICD-10-CM

## 2019-03-02 DIAGNOSIS — E876 Hypokalemia: Secondary | ICD-10-CM

## 2019-03-02 DIAGNOSIS — E785 Hyperlipidemia, unspecified: Secondary | ICD-10-CM | POA: Insufficient documentation

## 2019-03-02 MED ORDER — ROSUVASTATIN CALCIUM 5 MG PO TABS: 5.0000 mg | ORAL_TABLET | Freq: Every day | ORAL | 3 refills | Status: DC

## 2019-03-09 ENCOUNTER — Ambulatory Visit: Payer: Self-pay

## 2019-03-09 DIAGNOSIS — I10 Essential (primary) hypertension: Secondary | ICD-10-CM

## 2019-03-09 DIAGNOSIS — E7800 Pure hypercholesterolemia, unspecified: Secondary | ICD-10-CM

## 2019-03-09 NOTE — Chronic Care Management (AMB) (Signed)
    Care Management   Unsuccessful Call Note 03/09/2019 Name: THERSA MERRIAM MRN: [id number] DOB: 1956/08/10  Claretha Cooper. Emick is a 63 year old female who sees Maurice Small, FNP for primary care. Ms. Annye Asa asked the CCM team to consult the patient for chronic care management and care coordination secondary to need for assistance with applying for medicaid. Per chart review, patient would also benefit from lifestyle modification education related to her chronic conditions. Patient has a history of severe obesity, hyperlipidemia, and htn. Referral was placed 02/27/2019 during office visit..   Was unable to reach patient via telephone today for introduction of CCM services. Unfortunately I was unable to leave a  HIPAA compliant voicemail asking patient to return my call as phone continued to ring. (unsuccessful outreach #1).   Plan: Will follow-up within 7 business days via telephone.       Phineas Mcenroe E. Suzie Portela, RN, BSN Nurse Care Coordinator [facility] / [facility] Care Management  [phone]

## 2019-03-16 ENCOUNTER — Telehealth: Payer: Self-pay | Admitting: Family Medicine

## 2019-03-16 NOTE — Telephone Encounter (Signed)
Called no voicemail setup, will try again

## 2019-03-16 NOTE — Telephone Encounter (Addendum)
-----   Message from Doren Custard, FNP sent at 03/02/2019  9:26 AM EDT ----- Regarding: Call patient to come in for lab recheck. Call patient to come in for lab recheck. We need to check her potassium levels and her A1C due to elevated blood sugar in the past.  The orders are in, if she requests self-pay estimate, please provide. Thanks!

## 2019-03-17 NOTE — Telephone Encounter (Signed)
Tried to call again no voice mail set up

## 2019-03-18 ENCOUNTER — Ambulatory Visit: Payer: Self-pay

## 2019-03-18 DIAGNOSIS — I10 Essential (primary) hypertension: Secondary | ICD-10-CM

## 2019-03-18 DIAGNOSIS — E7800 Pure hypercholesterolemia, unspecified: Secondary | ICD-10-CM

## 2019-03-18 NOTE — Chronic Care Management (AMB) (Signed)
    Care Management   Unsuccessful Call Note 03/09/2019 Name: Pamela Hanson   MRN: [id number]       DOB: 08/03/1956  Pamela Hanson is a 63 year old femalewho sees Maurice Small, FNP for primary care. [name] the CCM team to consult the patient for chronic care management and care coordination secondary to need for assistance with applying for medicaid. Per chart review, patient would also benefit from lifestyle modification education related to her chronic conditions. Patient has a history of severe obesity, hyperlipidemia, and htn. Referral was placed5/10/2018 during office visit..  Was unable to reach patient via telephone today forintroduction of CCM services. Unfortunately I was unable to leave a  HIPAA compliant voicemail asking patient to return my call as phone continued to ring. (unsuccessful outreach #2).  Plan: Will follow-up within 7business days via telephone.    Shaneese Tait E. Suzie Portela, RN, BSN Nurse Care Coordinator [facility] / [facility] Care Management  [phone]

## 2019-03-26 ENCOUNTER — Ambulatory Visit: Payer: Self-pay

## 2019-03-26 NOTE — Chronic Care Management (AMB) (Signed)
   Care Management   Unsuccessful Call Note 03/09/2019 Name:Damiyah G BowmanMRN: [id number] DOB: 1956-07-08  Claretha Cooper. Bowmanis a 63year old femalewho seesEmily Annye Asa, FNPfor primary care. [name] the CCM team to consult the patient forchronic care management and care coordination secondary to need for assistance with applying for medicaid. Per chart review, patient would also benefit from lifestyle modification education related to her chronic conditions. Patient has a history of severe obesity, hyperlipidemia, and htn.Referral was placed5/10/2018 during office visit..  Was unable to reach patient via telephone today forintroduction of CCM services.Unfortunately I was unable to leave aHIPAA compliant voicemail asking patient to return my call as phone continued to ring. (unsuccessful outreach #3).  Plan: Referral will be closed as RN CM has had 3 unsuccessful attempts to contact patient. Will be happy to engage patient at a later time. Provider notified.   Setsuko Robins E. Suzie Portela, RN, BSN Nurse Care Coordinator [facility] / [facility] Care Management  [phone]

## 2019-04-07 ENCOUNTER — Ambulatory Visit: Payer: Self-pay | Admitting: *Deleted

## 2019-04-07 ENCOUNTER — Telehealth: Payer: Self-pay | Admitting: Family Medicine

## 2019-04-07 NOTE — Telephone Encounter (Signed)
Message from Nils Flack sent at 04/07/2019 4:33 PM EDT   Summary: arm pain   Pt is having pain in her arms after starting back on potassium and anti-inflamatory . She was told by pharmacist that it may help to call nurse. Please advise          Called patient back regarding the pain she is having in her arms. She stated that it started after starting back on potassium and the anti inflammatory medication. Per Up to Date reference, the anti inflammatory etodolac can cause muscle weakness. She is also having problems lifting objects. She also has a hx of degenerative disc disease. She is requesting an appointment for an office visit. Routing to [facility] for review and an appointment.  Reason for Disposition . Caller has NON-URGENT medication question about med that PCP prescribed and triager unable to answer question  Answer Assessment - Initial Assessment Questions 1. SYMPTOMS: "Do you have any symptoms?"     Yes? 2. SEVERITY: If symptoms are present, ask "Are they mild, moderate or severe?"     moderate  Protocols used: MEDICATION QUESTION CALL-A-AH

## 2019-04-07 NOTE — Telephone Encounter (Signed)
Copied from [place] [id number]. Topic: Quick Communication - Rx Refill/Question >> Apr 07, 2019  4:29 PM Nathanial Millman J wrote: Medication:  cyclobenzaprine (FLEXERIL) 10 MG tablet Has the patient contacted their pharmacy? Yes.   (Agent: If no, request that the patient contact the pharmacy for the refill.) (Agent: If yes, when and what did the pharmacy advise?)too soon to fill  Preferred Pharmacy (with phone number or street name): [facility] Pt is taking 3 per day.   Agent: Please be advised that RX refills may take up to 3 business days. We ask that you follow-up with your pharmacy.

## 2019-04-08 ENCOUNTER — Other Ambulatory Visit: Payer: Self-pay | Admitting: Emergency Medicine

## 2019-04-08 DIAGNOSIS — G8929 Other chronic pain: Secondary | ICD-10-CM

## 2019-04-08 DIAGNOSIS — M545 Low back pain, unspecified: Secondary | ICD-10-CM

## 2019-04-08 NOTE — Telephone Encounter (Signed)
Called NA 

## 2019-04-08 NOTE — Telephone Encounter (Signed)
Please call patient's son who is on DPR.

## 2019-04-08 NOTE — Telephone Encounter (Signed)
Called patient. NA.

## 2019-04-08 NOTE — Telephone Encounter (Signed)
Called patient NA. Left message for Rachel Bo at [phone] (son) to call office to let us know how to get in touch with his mother and how she is doing

## 2019-04-08 NOTE — Telephone Encounter (Signed)
If she is having extremity weakness, she needs to go to ER for evaluation of stroke.  If there are openings today with [facility] Mola and she refuses UC or ER, please put her on the schedule.  STOP Etodolac.

## 2019-04-08 NOTE — Telephone Encounter (Signed)
Please advise 

## 2019-04-08 NOTE — Telephone Encounter (Signed)
Order sent to Emily 

## 2019-06-01 ENCOUNTER — Encounter: Payer: Self-pay | Admitting: Family Medicine

## 2019-06-01 ENCOUNTER — Ambulatory Visit (INDEPENDENT_AMBULATORY_CARE_PROVIDER_SITE_OTHER): Payer: Self-pay | Admitting: Family Medicine

## 2019-06-01 ENCOUNTER — Other Ambulatory Visit: Payer: Self-pay

## 2019-06-01 ENCOUNTER — Telehealth: Payer: Self-pay | Admitting: Family Medicine

## 2019-06-01 ENCOUNTER — Other Ambulatory Visit: Payer: Self-pay | Admitting: Emergency Medicine

## 2019-06-01 VITALS — BP 118/76 | HR 93 | Temp 97.8°F | Resp 16 | Ht 59.0 in | Wt 175.2 lb

## 2019-06-01 DIAGNOSIS — G8929 Other chronic pain: Secondary | ICD-10-CM

## 2019-06-01 DIAGNOSIS — M545 Low back pain, unspecified: Secondary | ICD-10-CM

## 2019-06-01 DIAGNOSIS — K2100 Gastro-esophageal reflux disease with esophagitis, without bleeding: Secondary | ICD-10-CM

## 2019-06-01 DIAGNOSIS — E66812 Obesity, class 2: Secondary | ICD-10-CM

## 2019-06-01 DIAGNOSIS — I10 Essential (primary) hypertension: Secondary | ICD-10-CM

## 2019-06-01 DIAGNOSIS — Z6835 Body mass index (BMI) 35.0-35.9, adult: Secondary | ICD-10-CM

## 2019-06-01 DIAGNOSIS — R7301 Impaired fasting glucose: Secondary | ICD-10-CM | POA: Insufficient documentation

## 2019-06-01 DIAGNOSIS — E7800 Pure hypercholesterolemia, unspecified: Secondary | ICD-10-CM

## 2019-06-01 DIAGNOSIS — E876 Hypokalemia: Secondary | ICD-10-CM

## 2019-06-01 DIAGNOSIS — M791 Myalgia, unspecified site: Secondary | ICD-10-CM

## 2019-06-01 LAB — BASIC METABOLIC PANEL WITH GFR
BUN: 14 mg/dL (ref 7–25)
CO2: 27 mmol/L (ref 20–32)
Calcium: 9.7 mg/dL (ref 8.6–10.4)
Chloride: 104 mmol/L (ref 98–110)
Creat: 0.89 mg/dL (ref 0.50–0.99)
GFR, Est African American: 82 mL/min/{1.73_m2} (ref >=60)
GFR, Est Non African American: 70 mL/min/{1.73_m2} (ref >=60)
Glucose, Bld: 97 mg/dL (ref 65–99)
Potassium: 4.2 mmol/L (ref 3.5–5.3)
Sodium: 140 mmol/L (ref 135–146)

## 2019-06-01 LAB — CK: Total CK: 78 U/L (ref 29–143)

## 2019-06-01 MED ORDER — CYCLOBENZAPRINE HCL 10 MG PO TABS: 10.0000 mg | ORAL_TABLET | Freq: Every day | ORAL | 2 refills | Status: DC | PRN

## 2019-06-01 MED ORDER — POTASSIUM CHLORIDE ER 10 MEQ PO TBCR: 10.0000 meq | EXTENDED_RELEASE_TABLET | Freq: Every day | ORAL | 1 refills | Status: DC

## 2019-06-01 MED ORDER — OMEPRAZOLE 20 MG PO CPDR: 20.0000 mg | DELAYED_RELEASE_CAPSULE | Freq: Two times a day (BID) | ORAL | 1 refills | Status: DC

## 2019-06-01 MED ORDER — ETODOLAC 500 MG PO TABS: 500.0000 mg | ORAL_TABLET | Freq: Two times a day (BID) | ORAL | 2 refills | Status: DC | PRN

## 2019-06-01 MED ORDER — CYCLOBENZAPRINE HCL 10 MG PO TABS: 10.0000 mg | ORAL_TABLET | Freq: Three times a day (TID) | ORAL | 0 refills | Status: DC

## 2019-06-01 MED ORDER — CYCLOBENZAPRINE HCL 10 MG PO TABS: 10.0000 mg | ORAL_TABLET | Freq: Three times a day (TID) | ORAL | 2 refills | Status: DC

## 2019-06-01 NOTE — Telephone Encounter (Signed)
Per Bonnita Nasuti is seeing if she can just call this request in to pharm

## 2019-06-01 NOTE — Progress Notes (Signed)
Name: Pamela Hanson   MRN: [id number]    DOB: 08-Dec-1955   Date:06/01/2019       Progress Note  Subjective  Chief Complaint  Chief Complaint  Patient presents with  . Follow-up    blood pressure  . Muscle Pain    HPI  Hypokalemia and Muscle pain/cramping: She notes that she had an episode of cramping in the BLE and BUE - this started on 03/01/2019 when she was waiting on labs and potassium supplement to be sent in.  She has had improvement in the pain, but is still having consistent pain and some weakness in the arms. Notes the last 2 weeks have brought the most relief of her pain.  Denies shoulder girdle weakness. She never started taking her crestor that was prescribed in May.   Minimal swelling - has needed Lasix twice since last visit.  She currently does not have insurance and requests only minimal evaluation with labs at this time - discussed at length with her, we will perform BMP and CK, but she understands that this is not a comprehensive evaluation.  HTN:  -does take medications as prescribed - current regimen includes amlodipine 10mg  daily, but has been out for several days (was taking 2 of her 5mg  tablets).  She has been on potassium tablets with her Lasix for some time - takes 45mEq. - She was on HCTZ in the past, but developed pancreatitis and was taken off of this.  - taking medications as instructed, no medication side effects noted, no TIAs, no chest pain on exertion, no dyspnea on exertion, notes swelling of ankles bilaterally (helped with elevation and lasix), no orthostatic dizziness or lightheadedness - DASH diet discussed - pt does not follow a low sodium diet; Salt added in cooking only. - Unchanged.   GERD with esophagitis: She is no longer taking protonix because it caused constipation.  She notes that she has to avoid spicy foods because they cause heartburn.  She notes over the last month she has noticed a "pulling feeling" when she swallows her foods.  We  discussed risk of barrett's esophagus, does not want to see GI at this time.  Wants to wait to start omeprazole for the next 2 weeks - will call back when she is ready for medication or referral.  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  She has intermittent LEFT leg weakness, numbness and tingling that has been stable since last visit.   Taking Lodine BID for pain control (other NSAIDS cause GI upset), Flexeril PRN.  She talked at length about her options, and we will refill Liodine. Referred to Dr. Izora Ribas, however she does not have insurance right now; wants to wait until she has insurance to see him.  HLD: She never started taking her crestor.  ASCVD risk calculator score as below today.  Will not have her restart at this time due to muscle cramping. The 10-year ASCVD risk score Mikey Bussing DC Brooke Bonito., et al., 2013) is: 4.2%   Values used to calculate the score:     Age: 63 years     Sex: Female     Is Non-Hispanic African American: No     Diabetic: No     Tobacco smoker: No     Systolic Blood Pressure: 710 mmHg     Is BP treated: Yes  HDL Cholesterol: 64 mg/dL     Total Cholesterol: 259 mg/dL   Patient Active Problem List   Diagnosis Date Noted  . Hyperlipidemia 03/02/2019  . Essential hypertension 02/27/2019  . Class 2 severe obesity due to excess calories with serious comorbidity and body mass index (BMI) of 35.0 to 35.9 in adult (HCC) 02/27/2019  . Chronic bilateral low back pain without sciatica 02/27/2019  . History of hypokalemia 02/27/2019    No past surgical history on file.  No family history on file.  Social History   Socioeconomic History  . Marital status: Single    Spouse name: Not on file  . Number of children: 1  . Years of education: Not on file  . Highest education level: Not on file  Occupational History  . Not on file  Social  Needs  . Financial resource strain: Not hard at all  . Food insecurity    Worry: Never true    Inability: Never true  . Transportation needs    Medical: No    Non-medical: No  Tobacco Use  . Smoking status: Former Smoker    Types: Cigarettes  . Smokeless tobacco: Never Used  Substance and Sexual Activity  . Alcohol use: Yes    Comment: occ  . Drug use: Yes    Types: Marijuana  . Sexual activity: Not Currently  Lifestyle  . Physical activity    Days per week: 0 days    Minutes per session: 0 min  . Stress: Not at all  Relationships  . Social connections    Talks on phone: More than three times a week    Gets together: More than three times a week    Attends religious service: Never    Active member of club or organization: No    Attends meetings of clubs or organizations: Never    Relationship status: Never married  . Intimate partner violence    Fear of current or ex partner: No    Emotionally abused: No    Physically abused: No    Forced sexual activity: No  Other Topics Concern  . Not on file  Social History Narrative  . Not on file     Current Outpatient Medications:  .  amLODipine (NORVASC) 10 MG tablet, Take 1 tablet (10 mg total) by mouth daily., Disp: 90 tablet, Rfl: 3 .  cyclobenzaprine (FLEXERIL) 10 MG tablet, Take 1 tablet (10 mg total) by mouth daily as needed for muscle spasms., Disp: 30 tablet, Rfl: 2 .  etodolac (LODINE) 500 MG tablet, Take 1 tablet (500 mg total) by mouth 2 (two) times daily as needed (for pain)., Disp: 60 tablet, Rfl: 2 .  furosemide (LASIX) 20 MG tablet, Take 1 tablet (20 mg total) by mouth as needed., Disp: 90 tablet, Rfl: 0 .  potassium chloride (K-DUR) 10 MEQ tablet, Take 1 tablet (10 mEq total) by mouth daily., Disp: 90 tablet, Rfl: 1 .  rosuvastatin (CRESTOR) 5 MG tablet, Take 1 tablet (5 mg total) by mouth daily., Disp: 90 tablet, Rfl: 3 .  pantoprazole (PROTONIX) 40 MG tablet, Take 1 tablet (40 mg total) by mouth daily.  (Patient not taking: Reported on 02/27/2019), Disp: 30 tablet, Rfl: 1  No Known Allergies  I personally reviewed active problem list, medication list, allergies, health maintenance, notes from last encounter, lab results with the patient/caregiver today.   ROS  Ten systems reviewed and is negative except as mentioned in HPI  Objective  Vitals:   06/01/19  1105  BP: 118/76  Pulse: 93  Resp: 16  Temp: 97.8 F (36.6 C)  TempSrc: Oral  SpO2: 95%  Weight: 175 lb 3.2 oz (79.5 kg)  Height: 4\' 11"  (1.499 m)    Body mass index is 35.39 kg/m.  Physical Exam Constitutional: Patient appears well-developed and well-nourished. No distress.  HENT: Head: Normocephalic and atraumatic. Eyes: Conjunctivae and EOM are normal. No scleral icterus.  Neck: Normal range of motion. Neck supple. No JVD present. No thyromegaly present.  Cardiovascular: Normal rate, regular rhythm and normal heart sounds.  No murmur heard. No BLE edema. Pulmonary/Chest: Effort normal and breath sounds normal. No respiratory distress. Abdominal: Soft. Bowel sounds are normal, no distension. There is no tenderness. No masses. Musculoskeletal: Normal range of motion, no joint effusions. No gross deformities Neurological: Pt is alert and oriented to person, place, and time. No cranial nerve deficit. Coordination, balance, strength, speech and gait are normal.  Skin: Skin is warm and dry. No rash noted. No erythema.  Psychiatric: Patient has a normal mood and affect. behavior is normal. Judgment and thought content normal.  No results found for this or any previous visit (from the past 72 hour(s)).  PHQ2/9: Depression screen [facility] 06/01/2019 02/27/2019  Decreased Interest 0 0  Down, Depressed, Hopeless 0 0  PHQ - 2 Score 0 0  Altered sleeping 0 0  Tired, decreased energy 0 0  Change in appetite 0 0  Feeling bad or failure about yourself  0 0  Trouble concentrating 0 0  Moving slowly or fidgety/restless 0 0  Suicidal  thoughts 0 0  PHQ-9 Score 0 0  Difficult doing work/chores Not difficult at all Not difficult at all   PHQ-2/9 Result is negative.    Fall Risk: Fall Risk  06/01/2019 02/27/2019  Falls in the past year? 1 0  Number falls in past yr: 1 0  Injury with Fall? 0 0  Follow up Falls evaluation completed Falls evaluation completed   Assessment & Plan 1. Essential hypertension - At goal today, continue current regimen; lasix only PRN - BASIC METABOLIC PANEL WITH GFR - potassium chloride (K-DUR) 10 MEQ tablet; Take 1 tablet (10 mEq total) by mouth daily.  Dispense: 90 tablet; Refill: 1  2. Class 2 severe obesity due to excess calories with serious comorbidity and body mass index (BMI) of 35.0 to 35.9 in adult [facility](HCC) - Discussed importance of 150 minutes of physical activity weekly, eat two servings of fish weekly, eat one serving of tree nuts ( cashews, pistachios, pecans, almonds.Marland Kitchen.) every other day, eat 6 servings of fruit/vegetables daily and drink plenty of water and avoid sweet beverages.   3. Chronic bilateral low back pain without sciatica - Stable; cannot afford neurosurgeon at this time. - etodolac (LODINE) 500 MG tablet; Take 1 tablet (500 mg total) by mouth 2 (two) times daily as needed (for pain).  Dispense: 60 tablet; Refill: 2 - cyclobenzaprine (FLEXERIL) 10 MG tablet; Take 1 tablet (10 mg total) by mouth daily as needed for muscle spasms.  Dispense: 30 tablet; Refill: 2  4. Pure hypercholesterolemia - Will  Hold off on crestor at this time.  5. Hypokalemia - Ambulatory referral to Chronic Care Management Services - BASIC METABOLIC PANEL WITH GFR  6. Myalgia - BASIC METABOLIC PANEL WITH GFR - CK (Creatine Kinase) - Limited evaluation per patient request - see HPI above for discussion.  7. Gastroesophageal reflux disease with esophagitis - omeprazole (PRILOSEC) 20 MG capsule; Take 1 capsule (20  mg total) by mouth 2 (two) times daily before a meal.  Dispense: 60 capsule; Refill: 1  - Trial for 2-3 weeks, if not improving, she will call back and we will consider referral to GI at that time.

## 2019-06-01 NOTE — Patient Instructions (Addendum)
-   Take Calcium-Magnesium-Zinc also Vitamin D (1000IU once daily) Supplements over the counter to see if this helps your muscle aches - Omeprazole 20mg  1 tablet 1-2 times daily. - [email or web address] - May try Famotidine (Pepcid) or Ranitidine (Zantac)

## 2019-06-01 NOTE — Telephone Encounter (Signed)
Copied from [place] [id number]. Topic: Quick Communication - Rx Refill/Question >> Jun 01, 2019  1:58 PM Erick Blinks wrote: Medication: cyclobenzaprine (FLEXERIL) 10 MG tablet -Pt is missing this Rx from today's appt.  Has the patient contacted their pharmacy? Yes.   (Agent: If no, request that the patient contact the pharmacy for the refill.) (Agent: If yes, when and what did the pharmacy advise?)  Preferred Pharmacy (with phone number or street name): [facility] [place] [address] [place] [address] Phone: [phone] Fax: [phone]    Agent: Please be advised that RX refills may take up to 3 business days. We ask that you follow-up with your pharmacy.

## 2019-06-02 ENCOUNTER — Ambulatory Visit: Payer: Self-pay | Admitting: *Deleted

## 2019-06-02 DIAGNOSIS — I10 Essential (primary) hypertension: Secondary | ICD-10-CM

## 2019-06-02 DIAGNOSIS — G8929 Other chronic pain: Secondary | ICD-10-CM

## 2019-06-02 NOTE — Telephone Encounter (Signed)
Patient states that when she picked up this medication from pharmacy, the tablet looked different. She states the pill used to be blue but now it's white. Per pharmacy, patient is to "try it out" and contact PCP if any symptoms/reaction arise. Patient stated that she just wanted to document incase she has another "episode" with her pancreas.

## 2019-06-02 NOTE — Patient Instructions (Addendum)
Thank you allowing the Chronic Care Management Team to be a part of your care! It was a pleasure speaking with you today!   1. Care Management services include personalized support from designated clinical staff supervised by her physician, including individualized plan of care and coordination with other care providers 2. 24/7 contact phone numbers for assistance for urgent and routine care needs. 3. The patient may stop Care Management services at any time (effective at the end of the month) by phone call to the office staff.   CCM (Chronic Care Management) Team   Trish Fountain RN, BSN Nurse Care Coordinator  [phone]  Ruben Reason PharmD  Clinical Pharmacist  [phone]   [place], LCSW Clinical Social Worker [phone]  Goals Addressed   None      The patient verbalized understanding of instructions provided today and declined a print copy of patient instruction materials.   Telephone follow up appointment with care management team member scheduled for:06/04/19

## 2019-06-02 NOTE — Chronic Care Management (AMB) (Signed)
  Care Management    Clinical Social Work Care Management Outreach Note  06/02/2019 Name: Pamela Hanson MRN: [id number] DOB: Jan 17, 1956  Pamela Hanson is a 63 y.o. year old female who is a primary care patient of Hubbard Hartshorn, FNP . The Care Management team was consulted for assistance with [employer] .   LCSW reached out to Sheppard Coil today by phone to introduce and offer Care Management services.   [name] was given information about Care Management services today including:  1. Care Management services include personalized support from designated clinical staff supervised by her physician, including individualized plan of care and coordination with other care providers 2. 24/7 contact phone numbers for assistance for urgent and routine care needs. 3. The patient may stop Care Management services at any time (effective at the end of the month) by phone call to the office staff.   Patient agreed to services and verbal consent obtained.   Follow Up Plan: Appointment scheduled for SW follow up with client by phone on: 06/04/2019  Elliot Gurney, [place] Worker  [facility] Care Management [phone]

## 2019-06-04 ENCOUNTER — Encounter: Payer: Self-pay | Admitting: *Deleted

## 2019-06-04 ENCOUNTER — Ambulatory Visit: Payer: Self-pay | Admitting: *Deleted

## 2019-06-04 DIAGNOSIS — G8929 Other chronic pain: Secondary | ICD-10-CM

## 2019-06-04 DIAGNOSIS — M545 Low back pain, unspecified: Secondary | ICD-10-CM

## 2019-06-04 NOTE — Patient Instructions (Signed)
Thank you allowing the Chronic Care Management Team to be a part of your care! It was a pleasure speaking with you today!  1. Please call the Social Security office to initiate the application process for disability [phone]  CCM (Chronic Care Management) Team   Trish Fountain RN, BSN Nurse Care Coordinator  [phone]  Ruben Reason PharmD  Clinical Pharmacist  [phone]   Elliot Gurney, [place] Social Worker [phone]  Goals Addressed            This Visit's Progress   . "I need help applying for Medicaid and Disability" (pt-stated)       Current Barriers:  . Financial constraints related to being uninsured   Clinical Social Work Clinical Goal(s):  Marland Kitchen Over the next 90 days, client will follow up with social security as directed by SW to start the process for applying for disability  Interventions: . Patient interviewed and appropriate assessments performed . Provided patient with information about the application process for Medicaid and Disability . Discussed plans with patient for ongoing care management follow up and provided patient with direct contact information for care management team . Advised patient to contact the disability office to initiate application process, contact number of [phone] provided  Patient Self Care Activities:  . Performs ADL's independently . Performs IADL's independently . Calls provider office for new concerns or questions  Initial goal documentation         The patient verbalized understanding of instructions provided today and declined a print copy of patient instruction materials.   Telephone follow up appointment with care management team member scheduled for:06/11/19

## 2019-06-04 NOTE — Chronic Care Management (AMB) (Signed)
   Care Management    Clinical Social Work General Note  06/04/2019 Name: Pamela Hanson MRN: [id number] DOB: 12/15/1955  Pamela Hanson is a 63 y.o. year old female who is a primary care patient of Hubbard Hartshorn, FNP. The CCM was consulted to assist the patient with Financial Difficulties related to having no income and being unisured.    Review of patient status, including review of consultants reports, relevant laboratory and other test results, and collaboration with appropriate care team members and the patient's provider was performed as part of comprehensive patient evaluation and provision of chronic care management services.    SDOH (Social Determinants of Health) screening performed today. See Care Plan Entry related to challenges with: Financial Strain  Stress Physical Activity  Goals Addressed            This Visit's Progress   . "I need help applying for Medicaid and Disability" (pt-stated)       Phone call to patient today who reports having no insurance or income. Per patient, she would like help applying for Medicaid and Disability. Per patient, she had Medicaid in the past when her son was a minor but once he aged out, her Medicaid was cancelled. Patient states that she may have applied for disability several years ago, however was not sure of the outcome. Patient resides with her son, who is her primary caregiver.   Current Barriers:  . Financial constraints related to being uninsured   Clinical Social Work Clinical Goal(s):  Marland Kitchen Over the next 90 days, client will follow up with social security as directed by SW to start the process for applying for disability  Interventions: . Patient interviewed and appropriate assessments performed . Provided patient with information about the application process for Medicaid and Disability . Discussed plans with patient for ongoing care management follow up and provided patient with direct contact information for care  management team . Advised patient to contact the disability office to initiate application process, contact number of [phone] provided  Patient Self Care Activities:  . Performs ADL's independently . Performs IADL's independently . Calls provider office for new concerns or questions  Initial goal documentation         Follow Up Plan: SW will follow up with patient by phone over the next 2 weeks       [place], [place] Worker  [facility] Care Management [phone]

## 2019-06-19 ENCOUNTER — Ambulatory Visit: Payer: Self-pay | Admitting: *Deleted

## 2019-06-19 ENCOUNTER — Encounter: Payer: Self-pay | Admitting: *Deleted

## 2019-06-19 NOTE — Chronic Care Management (AMB) (Signed)
    Care Management   Unsuccessful Call Note 06/19/2019 Name: Pamela Hanson MRN: [id number] DOB: [birth year]  Patient  is a 63 year old female who sees Raelyn Ensign, FNP for primary care. Raelyn Ensign, FNP asked the CCM team to assist the patient with Financial Difficulties related to having no income and being unisured.     This social worker was unable to reach patient via telephone today to follow up on status of applying for disability. Voicemail could not be reached, the phone rang several times with no voicemail. (unsuccessful outreach #1).   Plan: Will follow-up within 7 business days via telephone.     Elliot Gurney, [place] Administrator, [facility] [phone]

## 2019-06-19 NOTE — Progress Notes (Signed)
This encounter was created in error - please disregard.

## 2019-06-22 ENCOUNTER — Ambulatory Visit: Payer: Self-pay | Admitting: *Deleted

## 2019-06-22 ENCOUNTER — Encounter: Payer: Self-pay | Admitting: *Deleted

## 2019-06-22 ENCOUNTER — Telehealth: Payer: Self-pay | Admitting: *Deleted

## 2019-06-22 NOTE — Progress Notes (Signed)
This encounter was created in error - please disregard.

## 2019-06-22 NOTE — Chronic Care Management (AMB) (Signed)
    Care Management   Unsuccessful Call Note 06/22/2019 Name: Pamela Hanson MRN: [id number] DOB: 04-25-1956  Patient  is a 63 year old femalewho sees Raelyn Ensign, FNP for primary care. Raelyn Ensign, FNPasked the CCM team to assist the patient withFinancial Difficulties related tohaving no income and being unisured.   This social worker was unable to reach patient via telephone today to follow up on status of applying for disability. Voicemail could not be reached, the phone rang several times with no voicemail. (unsuccessful outreach #2).    Plan: Will follow-up within 7 business days via telephone.     Elliot Gurney, [place] Administrator, [facility] Management [phone]

## 2019-06-29 ENCOUNTER — Ambulatory Visit: Payer: Self-pay | Admitting: *Deleted

## 2019-06-29 ENCOUNTER — Telehealth: Payer: Self-pay

## 2019-06-29 NOTE — Chronic Care Management (AMB) (Signed)
     Care Management   Unsuccessful Call Note 06/29/2019 Name: Pamela Hanson MRN: [id number] DOB: December 11, 1955   Patientis a 63year old femalewho seesEmily Uvaldo Rising, FNPfor primary care. Raelyn Ensign, FNPasked the CCM teamto assist the patient withFinancial Difficulties related tohaving no income and being unisured.  This social worker was unable to reach patient via telephone todayto follow up on status of applying for disability.Voicemail could not be reached, the phone rang several times with no voicemail. (unsuccessful outreach #3).    Plan: This Education officer, museum will cease all further calls to patient due to difficulty in maintaining contact. This social worker will be happy to engage patient upon her return call.    Elliot Gurney, [place] Administrator, [facility] Care Management [phone]

## 2019-07-23 ENCOUNTER — Ambulatory Visit: Payer: Self-pay | Admitting: Family Medicine

## 2019-08-04 ENCOUNTER — Ambulatory Visit: Payer: Self-pay | Admitting: Family Medicine

## 2019-08-04 ENCOUNTER — Other Ambulatory Visit: Payer: Self-pay | Admitting: Emergency Medicine

## 2019-08-04 DIAGNOSIS — K2100 Gastro-esophageal reflux disease with esophagitis, without bleeding: Secondary | ICD-10-CM

## 2019-08-04 MED ORDER — OMEPRAZOLE 20 MG PO CPDR: 20.0000 mg | DELAYED_RELEASE_CAPSULE | Freq: Two times a day (BID) | ORAL | 0 refills | Status: DC

## 2019-08-31 ENCOUNTER — Telehealth: Payer: Self-pay | Admitting: Emergency Medicine

## 2019-08-31 DIAGNOSIS — M545 Low back pain, unspecified: Secondary | ICD-10-CM

## 2019-08-31 DIAGNOSIS — G8929 Other chronic pain: Secondary | ICD-10-CM

## 2019-08-31 MED ORDER — CYCLOBENZAPRINE HCL 10 MG PO TABS: 10.0000 mg | ORAL_TABLET | Freq: Three times a day (TID) | ORAL | 0 refills | Status: DC

## 2019-08-31 MED ORDER — ETODOLAC 500 MG PO TABS: 500.0000 mg | ORAL_TABLET | Freq: Two times a day (BID) | ORAL | 0 refills | Status: DC | PRN

## 2019-08-31 NOTE — Telephone Encounter (Signed)
Pt has had 2 no-shows; needs follow up in the next week

## 2019-09-01 ENCOUNTER — Other Ambulatory Visit: Payer: Self-pay | Admitting: Family Medicine

## 2019-09-01 DIAGNOSIS — G8929 Other chronic pain: Secondary | ICD-10-CM

## 2019-09-01 NOTE — Telephone Encounter (Signed)
Phone just rang and rang no vm picked up to get aapt and tekk her meds ae approved

## 2019-09-01 NOTE — Telephone Encounter (Signed)
Requested medication (s) are due for refill today: yes  Requested medication (s) are on the active medication list: ys  Last refill:  06/01/19  Future visit scheduled: no  Notes to clinic:  Refill cannot be delegated    Requested Prescriptions  Pending Prescriptions Disp Refills   cyclobenzaprine (FLEXERIL) 10 MG tablet [Pharmacy Med Name: cyclobenzaprine 10 mg tablet] 90 tablet 0    Sig: TAKE ONE TABLET BY MOUTH THREE TIMES DAILY     Not Delegated - Analgesics:  Muscle Relaxants Failed - 09/01/2019  9:28 AM      Failed - This refill cannot be delegated      Passed - Valid encounter within last 6 months    Recent Outpatient Visits          3 months ago Essential hypertension   [place], [place], FNP   6 months ago Essential hypertension   [place], Astrid Divine, [place]

## 2019-09-02 ENCOUNTER — Telehealth: Payer: Self-pay | Admitting: Family Medicine

## 2019-09-02 ENCOUNTER — Other Ambulatory Visit: Payer: Self-pay | Admitting: Family Medicine

## 2019-09-02 DIAGNOSIS — G8929 Other chronic pain: Secondary | ICD-10-CM

## 2019-09-03 NOTE — Telephone Encounter (Signed)
No answer and no option for VM for pt to schedule an appt for refills

## 2019-09-04 NOTE — Telephone Encounter (Signed)
Last seen in August. How do you to refill medication.

## 2019-09-04 NOTE — Telephone Encounter (Signed)
Pt stated she need her meds and she can't come in for an office visit because she don't have insurance. Pt stated to refill her meds and she will come in 1st of year. Please call pt to advise

## 2019-09-04 NOTE — Telephone Encounter (Signed)
May refill through January 2021, must come in for 5-6 month follow up or we will not be able to provide additional refills.  Pend any needed medications.

## 2019-09-07 ENCOUNTER — Other Ambulatory Visit: Payer: Self-pay | Admitting: Family Medicine

## 2019-09-07 DIAGNOSIS — G8929 Other chronic pain: Secondary | ICD-10-CM

## 2019-09-07 DIAGNOSIS — I10 Essential (primary) hypertension: Secondary | ICD-10-CM

## 2019-09-07 NOTE — Telephone Encounter (Signed)
Please let patient know pt is due for follow up and has not answered phone, so I cannot fill for #60, #14 is all I can provide until able to be seen.

## 2019-09-07 NOTE — Telephone Encounter (Signed)
Left message for patient

## 2019-09-07 NOTE — Telephone Encounter (Signed)
Requested medication (s) are due for refill today: yes  Requested medication (s) are on the active medication list: yes  Last refill:  08/31/2019  Future visit scheduled: no  Notes to clinic:  rx written on 11/2 was for qty of 14 tablets. patient usually gets 60 tablets. would dr be willng to cancel rx for 14 tablets and authorize new rx for 60?   Requested Prescriptions  Pending Prescriptions Disp Refills   etodolac (LODINE) 500 MG tablet [Pharmacy Med Name: etodolac 500 mg tablet] 14 tablet 0    Sig: TAKE ONE TABLET BY MOUTH TWICE DAILY AS NEEDED FOR PAIN FOR 7 DAYS     Analgesics:  NSAIDS Failed - 09/07/2019  8:00 AM      Failed - HGB in normal range and within 360 days    Hemoglobin  Date Value Ref Range Status  06/05/2018 14.8 12.0 - 16.0 g/dL Final         Passed - Cr in normal range and within 360 days    Creat  Date Value Ref Range Status  06/01/2019 0.89 0.50 - 0.99 mg/dL Final    Comment:    For patients >72 years of age, the reference limit for Creatinine is approximately 13% higher for people identified as African-American. Renella Cunas - Patient is not pregnant      Passed - Valid encounter within last 12 months    Recent Outpatient Visits          3 months ago Essential hypertension   [place], [place], FNP   6 months ago Essential hypertension   [place], Astrid Divine, [place]

## 2019-09-08 MED ORDER — ETODOLAC 500 MG PO TABS: 500.0000 mg | ORAL_TABLET | Freq: Two times a day (BID) | ORAL | 0 refills | Status: DC | PRN

## 2019-09-08 MED ORDER — AMLODIPINE BESYLATE 10 MG PO TABS: 10.0000 mg | ORAL_TABLET | Freq: Every day | ORAL | 0 refills | Status: DC

## 2019-09-08 NOTE — Telephone Encounter (Signed)
Cannot afford to come in to be seen and get her medication. Do not have medicaid or SSI. Will not be able to get any benefits or do not have any funds. She would like to get at least enough BP medication, potassium and Etoldolac til January. Patient stated she has decide which things are important and put them first,

## 2019-09-08 NOTE — Telephone Encounter (Signed)
90 day supply is sent in for etolac and amlodipine.  She will need a visit in January or we cannot continue to prescribe.  She may want to look into [facility] - please provide information if she would like.

## 2019-09-09 NOTE — Telephone Encounter (Signed)
Patient notified to make appointment 

## 2019-10-22 ENCOUNTER — Other Ambulatory Visit: Payer: Self-pay | Admitting: Emergency Medicine

## 2019-10-22 DIAGNOSIS — G8929 Other chronic pain: Secondary | ICD-10-CM

## 2019-10-22 DIAGNOSIS — K2100 Gastro-esophageal reflux disease with esophagitis, without bleeding: Secondary | ICD-10-CM

## 2019-10-22 DIAGNOSIS — M545 Low back pain, unspecified: Secondary | ICD-10-CM

## 2019-10-27 MED ORDER — OMEPRAZOLE 20 MG PO CPDR: 20.0000 mg | DELAYED_RELEASE_CAPSULE | Freq: Two times a day (BID) | ORAL | 0 refills | Status: DC

## 2019-11-25 ENCOUNTER — Telehealth: Payer: Self-pay | Admitting: Emergency Medicine

## 2019-11-25 NOTE — Telephone Encounter (Signed)
Thank you for reaching out.  She is overdue for follow up - had 2 no shows - September and October 2020.  Will need follow up visit scheduled ASAP.

## 2019-11-25 NOTE — Telephone Encounter (Signed)
I called patient regarding this message but I cannot reach her. I reached out to [facility] and she stated that the Pharmacist that was handling this was not there today and she was not sure of what was going on.

## 2019-11-25 NOTE — Telephone Encounter (Signed)
Copied from CRM [id number]. Topic: General - Inquiry >> Nov 23, 2019  3:56 PM Leary Roca wrote: Reason for CRM: Pharmacy called in with concerns regarding pts medications . She has been self dosing. Please reach out to patient as soon as possible .  Please advise

## 2019-11-25 NOTE — Telephone Encounter (Signed)
Called patient. NA.

## 2020-01-07 ENCOUNTER — Other Ambulatory Visit: Payer: Self-pay | Admitting: Family Medicine

## 2020-01-07 DIAGNOSIS — G8929 Other chronic pain: Secondary | ICD-10-CM

## 2020-01-07 DIAGNOSIS — I10 Essential (primary) hypertension: Secondary | ICD-10-CM

## 2020-01-07 DIAGNOSIS — K2100 Gastro-esophageal reflux disease with esophagitis, without bleeding: Secondary | ICD-10-CM

## 2020-01-07 MED ORDER — AMLODIPINE BESYLATE 10 MG PO TABS: 10.0000 mg | ORAL_TABLET | Freq: Every day | ORAL | 0 refills | Status: DC

## 2020-01-07 MED ORDER — ETODOLAC 500 MG PO TABS: 500.0000 mg | ORAL_TABLET | Freq: Two times a day (BID) | ORAL | 0 refills | Status: DC | PRN

## 2020-01-07 MED ORDER — OMEPRAZOLE 20 MG PO CPDR: 20.0000 mg | DELAYED_RELEASE_CAPSULE | Freq: Two times a day (BID) | ORAL | 0 refills | Status: DC

## 2020-01-07 MED ORDER — FUROSEMIDE 20 MG PO TABS: 20.0000 mg | ORAL_TABLET | ORAL | 0 refills | Status: DC | PRN

## 2020-01-07 MED ORDER — POTASSIUM CHLORIDE ER 10 MEQ PO TBCR: 10.0000 meq | EXTENDED_RELEASE_TABLET | Freq: Every day | ORAL | 0 refills | Status: DC

## 2020-01-07 NOTE — Telephone Encounter (Signed)
Requested medication (s) are due for refill today: yes  Requested medication (s) are on the active medication list: yes  Last refill:  10/22/2019  Future visit scheduled: no; see telephone encounter dated 01/07/20  Notes to clinic:  not delegated; no valid encounter within last 6 months    Requested Prescriptions  Pending Prescriptions Disp Refills   cyclobenzaprine (FLEXERIL) 10 MG tablet 14 tablet 0    Sig: Take 1 tablet (10 mg total) by mouth 3 (three) times daily.      Not Delegated - Analgesics:  Muscle Relaxants Failed - 01/07/2020 10:19 AM      Failed - This refill cannot be delegated      Failed - Valid encounter within last 6 months    Recent Outpatient Visits           7 months ago Essential hypertension   [place], FNP   10 months ago Essential hypertension   [place], [place]               Signed Prescriptions Disp Refills   amLODipine (NORVASC) 10 MG tablet 30 tablet 0    Sig: Take 1 tablet (10 mg total) by mouth daily.      Cardiovascular:  Calcium Channel Blockers Failed - 01/07/2020 10:03 AM      Failed - Valid encounter within last 6 months    Recent Outpatient Visits           7 months ago Essential hypertension   [place], Astrid Divine, FNP   10 months ago Essential hypertension   [place], [place]              Passed - Last BP in normal range    BP Readings from Last 1 Encounters:  06/01/19 118/76            etodolac (LODINE) 500 MG tablet 90 tablet 0    Sig: Take 1 tablet (500 mg total) by mouth 2 (two) times daily as needed (for pain).      Analgesics:  NSAIDS Failed - 01/07/2020 10:03 AM      Failed - HGB in normal range and within 360 days    Hemoglobin  Date Value Ref Range Status  06/05/2018 14.8 12.0 - 16.0 g/dL Final          Passed - Cr in normal range and within 360 days    Creat  Date  Value Ref Range Status  06/01/2019 0.89 0.50 - 0.99 mg/dL Final    Comment:    For patients >69 years of age, the reference limit for Creatinine is approximately 13% higher for people identified as African-American. Renella Cunas - Patient is not pregnant      Passed - Valid encounter within last 12 months    Recent Outpatient Visits           7 months ago Essential hypertension   [place], FNP   10 months ago Essential hypertension   [place], Raquel Sarna E, FNP                furosemide (LASIX) 20 MG tablet 30 tablet 0    Sig: Take 1 tablet (20 mg total) by mouth as needed.      Cardiovascular:  Diuretics - Loop Failed -  01/07/2020 10:03 AM      Failed - Valid encounter within last 6 months    Recent Outpatient Visits           7 months ago Essential hypertension   [facility] Doren Custard, FNP   10 months ago Essential hypertension   [facility] [place], Gerome Apley, [place]              Passed - K in normal range and within 360 days    Potassium  Date Value Ref Range Status  06/01/2019 4.2 3.5 - 5.3 mmol/L Final          Passed - Ca in normal range and within 360 days    Calcium  Date Value Ref Range Status  06/01/2019 9.7 8.6 - 10.4 mg/dL Final          Passed - Na in normal range and within 360 days    Sodium  Date Value Ref Range Status  06/01/2019 140 135 - 146 mmol/L Final          Passed - Cr in normal range and within 360 days    Creat  Date Value Ref Range Status  06/01/2019 0.89 0.50 - 0.99 mg/dL Final    Comment:    For patients >9 years of age, the reference limit for Creatinine is approximately 13% higher for people identified as African-American. .           Passed - Last BP in normal range    BP Readings from Last 1 Encounters:  06/01/19 118/76            omeprazole (PRILOSEC) 20 MG capsule 30 capsule 0    Sig:  Take 1 capsule (20 mg total) by mouth 2 (two) times daily before a meal.      Gastroenterology: Proton Pump Inhibitors Passed - 01/07/2020 10:03 AM      Passed - Valid encounter within last 12 months    Recent Outpatient Visits           7 months ago Essential hypertension   [facility] Doren Custard, FNP   10 months ago Essential hypertension   [facility] [place], Irving Burton E, [place]                potassium chloride (KLOR-CON) 10 MEQ tablet 30 tablet 0    Sig: Take 1 tablet (10 mEq total) by mouth daily.      Endocrinology:  Minerals - Potassium Supplementation Passed - 01/07/2020 10:03 AM      Passed - K in normal range and within 360 days    Potassium  Date Value Ref Range Status  06/01/2019 4.2 3.5 - 5.3 mmol/L Final          Passed - Cr in normal range and within 360 days    Creat  Date Value Ref Range Status  06/01/2019 0.89 0.50 - 0.99 mg/dL Final    Comment:    For patients >85 years of age, the reference limit for Creatinine is approximately 13% higher for people identified as African-American. Verna Czech - Valid encounter within last 12 months    Recent Outpatient Visits           7 months ago Essential hypertension   [facility] Doren Custard, FNP   10 months ago Essential hypertension   [facility]  Doren Custard, FNP

## 2020-01-07 NOTE — Telephone Encounter (Signed)
Contacted pt regarding refill requests; no valid encounter within last 6 months; she states that she is not able to come into office for visit because she is not comfortable driving; she is not able to do a virtual visit because pf the phone she has; the pt is also requesting to be seen by a MD instead of an advanced provider; the pt would like a call back regarding this; she can be contacted at [phone]; will route to office for final disposition.  Requested medication (s) are due for refill today: Cyclobenzaprine, yes  Requested medication (s) are on the active medication list:yes  Last refill: 10/22/2019  Future visit scheduled: no  Notes to clinic: not delegated

## 2020-01-07 NOTE — Telephone Encounter (Signed)
Requested Prescriptions  Pending Prescriptions Disp Refills  . amLODipine (NORVASC) 10 MG tablet 90 tablet 0    Sig: Take 1 tablet (10 mg total) by mouth daily.     Cardiovascular:  Calcium Channel Blockers Failed - 01/07/2020 10:03 AM      Failed - Valid encounter within last 6 months    Recent Outpatient Visits          7 months ago Essential hypertension   [place], [place], FNP   10 months ago Essential hypertension   [place], [place]             Passed - Last BP in normal range    BP Readings from Last 1 Encounters:  06/01/19 118/76         . cyclobenzaprine (FLEXERIL) 10 MG tablet 14 tablet 0    Sig: Take 1 tablet (10 mg total) by mouth 3 (three) times daily.     Not Delegated - Analgesics:  Muscle Relaxants Failed - 01/07/2020 10:03 AM      Failed - This refill cannot be delegated      Failed - Valid encounter within last 6 months    Recent Outpatient Visits          7 months ago Essential hypertension   [place], Astrid Divine, FNP   10 months ago Essential hypertension   [place], Raquel Sarna E, [place]             . etodolac (LODINE) 500 MG tablet 90 tablet 0    Sig: Take 1 tablet (500 mg total) by mouth 2 (two) times daily as needed (for pain).     Analgesics:  NSAIDS Failed - 01/07/2020 10:03 AM      Failed - HGB in normal range and within 360 days    Hemoglobin  Date Value Ref Range Status  06/05/2018 14.8 12.0 - 16.0 g/dL Final         Passed - Cr in normal range and within 360 days    Creat  Date Value Ref Range Status  06/01/2019 0.89 0.50 - 0.99 mg/dL Final    Comment:    For patients >66 years of age, the reference limit for Creatinine is approximately 13% higher for people identified as African-American. Pamela Hanson - Patient is not pregnant      Passed - Valid encounter within last 12 months    Recent Outpatient Visits           7 months ago Essential hypertension   [place], FNP   10 months ago Essential hypertension   [place], Raquel Sarna E, [place]             . furosemide (LASIX) 20 MG tablet 90 tablet 0    Sig: Take 1 tablet (20 mg total) by mouth as needed.     Cardiovascular:  Diuretics - Loop Failed - 01/07/2020 10:03 AM      Failed - Valid encounter within last 6 months    Recent Outpatient Visits          7 months ago Essential hypertension   [place], [place], FNP   10 months ago Essential hypertension   [place], Astrid Divine, [place]  Passed - K in normal range and within 360 days    Potassium  Date Value Ref Range Status  06/01/2019 4.2 3.5 - 5.3 mmol/L Final         Passed - Ca in normal range and within 360 days    Calcium  Date Value Ref Range Status  06/01/2019 9.7 8.6 - 10.4 mg/dL Final         Passed - Na in normal range and within 360 days    Sodium  Date Value Ref Range Status  06/01/2019 140 135 - 146 mmol/L Final         Passed - Cr in normal range and within 360 days    Creat  Date Value Ref Range Status  06/01/2019 0.89 0.50 - 0.99 mg/dL Final    Comment:    For patients >83 years of age, the reference limit for Creatinine is approximately 13% higher for people identified as African-American. .          Passed - Last BP in normal range    BP Readings from Last 1 Encounters:  06/01/19 118/76         . omeprazole (PRILOSEC) 20 MG capsule 60 capsule 0    Sig: Take 1 capsule (20 mg total) by mouth 2 (two) times daily before a meal.     Gastroenterology: Proton Pump Inhibitors Passed - 01/07/2020 10:03 AM      Passed - Valid encounter within last 12 months    Recent Outpatient Visits          7 months ago Essential hypertension   [facility] Doren Custard, FNP   10 months ago Essential hypertension    [facility] [place], Gerome Apley, [place]             . potassium chloride (KLOR-CON) 10 MEQ tablet 90 tablet 1    Sig: Take 1 tablet (10 mEq total) by mouth daily.     Endocrinology:  Minerals - Potassium Supplementation Passed - 01/07/2020 10:03 AM      Passed - K in normal range and within 360 days    Potassium  Date Value Ref Range Status  06/01/2019 4.2 3.5 - 5.3 mmol/L Final         Passed - Cr in normal range and within 360 days    Creat  Date Value Ref Range Status  06/01/2019 0.89 0.50 - 0.99 mg/dL Final    Comment:    For patients >43 years of age, the reference limit for Creatinine is approximately 13% higher for people identified as African-American. Pamela Hanson - Valid encounter within last 12 months    Recent Outpatient Visits          7 months ago Essential hypertension   [facility] Doren Custard, FNP   10 months ago Essential hypertension   [facility] [place], Gerome Apley, [place]

## 2020-01-07 NOTE — Telephone Encounter (Signed)
Copied from CRM [id number]. Topic: Quick Communication - Rx Refill/Question >> Jan 07, 2020  9:35 AM Dalphine Handing A wrote: Medication:amLODipine (NORVASC) 10 MG tablet ,cyclobenzaprine (FLEXERIL) 10 MG tablet,potassium chloride (K-DUR) 20 MEQ tablet,omeprazole (PRILOSEC) 20 MG capsule ,etodolac (LODINE) 500 MG tablet,furosemide (LASIX) 20 MG tablet  Has the patient contacted their pharmacy? Yes (Agent: If no, request that the patient contact the pharmacy for the refill.) (Agent: If yes, when and what did the pharmacy advise?)Contact PCP  Preferred Pharmacy (with phone number or street name): [facility] - [place], [place] - [address]  Phone:  [phone] Fax:  [phone]     Agent: Please be advised that RX refills may take up to 3 business days. We ask that you follow-up with your pharmacy.

## 2020-01-07 NOTE — Telephone Encounter (Signed)
Pt called reporting that she does not have enough money to come in at this time, she desperately needs to have a refill for 20 Mg not 10. She does not have insurance but is in the process of getting it. Please advise  [phone]

## 2020-01-18 NOTE — Telephone Encounter (Signed)
No answer. Refill has been sent but must be seen for additional refills

## 2020-01-18 NOTE — Telephone Encounter (Signed)
PER Emily before she left she had advised patient to do a virtual or office appointment. Last seen in August

## 2020-01-25 ENCOUNTER — Ambulatory Visit: Payer: Self-pay | Admitting: Emergency Medicine

## 2020-01-25 VITALS — BP 158/84 | HR 97 | Resp 18

## 2020-01-25 DIAGNOSIS — I10 Essential (primary) hypertension: Secondary | ICD-10-CM

## 2020-01-25 NOTE — Progress Notes (Signed)
Patient walked in for BP check. She stated she was light headed and dizzy and head diarrhea. She stated she had increased her Amlodipine from 10 mg to 15 mg on her own and her potassium from 10 mg to 15 mg. Once she saw that she was getting low on medication she dropped back down to 10 mg. Her BP here was 158/84. Patient made appointment to come in on 3/30 at 1:20 pm. Patient was informed to go to Urgent Care or ER if she continue to have any issues. She also stated that she had just came from [employer] office and was upset with outcome and that was probally why she felt like it was to high. Per Irving Burton notes she had been trying to get this patient to come back for follow up since August of 2020.

## 2020-01-26 ENCOUNTER — Ambulatory Visit (INDEPENDENT_AMBULATORY_CARE_PROVIDER_SITE_OTHER): Payer: Self-pay | Admitting: Internal Medicine

## 2020-01-26 ENCOUNTER — Encounter: Payer: Self-pay | Admitting: Internal Medicine

## 2020-01-26 ENCOUNTER — Other Ambulatory Visit: Payer: Self-pay

## 2020-01-26 VITALS — BP 132/80 | HR 93 | Temp 97.1°F | Resp 16 | Ht 59.0 in | Wt 163.4 lb

## 2020-01-26 DIAGNOSIS — K219 Gastro-esophageal reflux disease without esophagitis: Secondary | ICD-10-CM | POA: Insufficient documentation

## 2020-01-26 DIAGNOSIS — Z139 Encounter for screening, unspecified: Secondary | ICD-10-CM

## 2020-01-26 DIAGNOSIS — Z6833 Body mass index (BMI) 33.0-33.9, adult: Secondary | ICD-10-CM

## 2020-01-26 DIAGNOSIS — E6609 Other obesity due to excess calories: Secondary | ICD-10-CM

## 2020-01-26 DIAGNOSIS — E7800 Pure hypercholesterolemia, unspecified: Secondary | ICD-10-CM

## 2020-01-26 DIAGNOSIS — Z8639 Personal history of other endocrine, nutritional and metabolic disease: Secondary | ICD-10-CM | POA: Insufficient documentation

## 2020-01-26 DIAGNOSIS — I10 Essential (primary) hypertension: Secondary | ICD-10-CM

## 2020-01-26 DIAGNOSIS — M545 Low back pain, unspecified: Secondary | ICD-10-CM

## 2020-01-26 DIAGNOSIS — M791 Myalgia, unspecified site: Secondary | ICD-10-CM

## 2020-01-26 DIAGNOSIS — G8929 Other chronic pain: Secondary | ICD-10-CM

## 2020-01-26 NOTE — Progress Notes (Signed)
Last labs reviewed from August and included a BMP and CK, which were normal.    Patient ID: Pamela Hanson, female    DOB: Mar 06, 1956, 64 y.o.   MRN: [id number]  PCP: Hubbard Hartshorn, FNP  Chief Complaint  Patient presents with  . Hypokalemia  . Hypertension  . Gastroesophageal Reflux  . Hyperlipidemia    Subjective:   Pamela Hanson is a 64 y.o. female, presents to clinic with CC of the following:  Chief Complaint  Patient presents with  . Hypokalemia  . Hypertension  . Gastroesophageal Reflux  . Hyperlipidemia    HPI:  Patient is a 64 year old female patient of Raelyn Ensign who presents to follow-up today. Yesterday, she presented to have a blood pressure check with the following noted:  Patient walked in for BP check. She stated she was light headed and dizzy and head diarrhea. She stated she had increased her Amlodipine from 10 mg to 15 mg on her own and her potassium from 10 mg to 15 mg. Once she saw that she was getting low on medication she dropped back down to 10 mg. Her BP here was 158/84. Patient made appointment to come in on 3/30 at 1:20 pm. Patient was informed to go to Urgent Care or ER if she continue to have any issues. She also stated that she had just came from [employer] office and was upset with outcome and that was probally why she felt like it was to high. Per Raquel Sarna notes she had been trying to get this patient to come back for follow up since August of 2020.   She noted to me today that he has been battling severe achiness, dizzy episodes, peeing a lot, up at least 3 times a night to go sometimes more and thus, she increased her amlodipine to 15 mg a day and her potassium supplement to 15 mEq a day and states she felt much better.  When she called here for refills, she was given a 30-day supply, and wanted to try to make that last, so she went back to the 10 mg of amlodipine and 10 mEq of potassium and after a couple weeks, she started to feel more  achy again, and while she was driving yesterday, she felt "weird", felt dizzy again for the first time.  That is why she stopped here to have her blood pressure checked. She notes she has been drinking Pedialyte daily for 1 week, and also has had some loose bowel movements over this past couple weeks, with no blood in her stools, and no frank persistent diarrhea.  She notes her stools have firmed up some now.  She has been eating a lot of yogurt, bananas, but notes her diet is very limited due to her social arrangement as noted below  After her last visit with Raquel Sarna in August, the chronic care management team was involved briefly, but on follow-up attempts to reach the patient they were unsuccessful, and the patient was noted to have a couple no-shows after.  Her last visit with Raquel Sarna was August 2020 with the following issues addressed:  Hypokalemia and Muscle pain/cramping: In the past noted episodes of cramping in the BLE and BUE, one time in 02/2019 when she was waiting on labs and potassium supplement to be sent in.  She notes she is still having intermittent pain and some weakness in the arms, mostly the left.  She is having more pain now in the lower left rib cage region,  she is not sure if it is muscle based, and she is very worried that it may be from her pancreas.  (Has a history of pancreatitis in the past). She never started taking her crestor that was prescribed in May.     She currently does not have insurance and requests only minimal evaluation with labs was noted during her last visit with Raquel Sarna and she noted today that she does not have insurance, and can order the labs if it is billed to the [employer] office as she put it.  HTN:  Not check BP's at home  current regimen includes amlodipine 73m daily.  She has been on potassium tablets with her Lasix for some time - now takes 110m daily, takes the Lasix prn for swelling of feet/ankles and very rare use of any Lasix product.  As  noted above, often adjusts medication on her own as done recently,   She was on HCTZ in the past, but developed pancreatitis and was taken off of this.   no exertional CP, SOB, notesswelling of ankles bilaterally (which is why she was prescribed the lasix),   ptdoesnotfollow a low sodium diet BP Readings from Last 3 Encounters:  01/26/20 132/80  01/25/20 (!) 158/84  06/01/19 118/76    GERD with esophagitis: She is no longer taking protonix because it caused constipation. [facility] that over the last month she had noticed a "pulling feeling" when she swallows her foods.  We discussed risk of barrett's esophagus, and she did not want to see GI at that time.  She was eventually started on omeprazole, and was taking it twice a day.  She notes that when the last scripts were called in, it was for once a day, and her reflux symptoms have significantly worsened again.  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  She also noted to me she [place] "nerve root" injections. She has intermittent Left leg weakness, numbness and tingling  down the left leg, and described as pain when it occurs like the worst charley horse 1 can have. Taking Lodine BID for pain control (other NSAIDS cause GI upset), and states she could not get out of bed without the Lodine, also takes Flexeril PRN, now trying to take half of 1 at a time when she uses.  EmRaquel Sarnaeferred to Dr. YaIzora Ribashowever she did not have insurance, wanted to wait until she has insurance to see him.She did not see Dr. YaCari Caraway  HLD: She never started taking her crestor, with muscle cramping causing concern for starting Lab Results  Component Value Date   CHOL 259 (H) 02/27/2019   HDL 64 02/27/2019   LDLCALC 167 (H) 02/27/2019   TRIG 140  02/27/2019   CHOLHDL 4.0 02/27/2019    Last labs reviewed from August were reviewed with the patient today and included a BMP and CK, which were normal.  Tob - quit a while ago Alcohol - denied Lives with son, his girlfriend and child, noted a girlfriend went somewhere with complaints about the patient and concern for the child which was upsetting to her.  Has to eat frozen food and stay in her room and is a very poor living environment, has no income.  She also went  on a long story about being caught up in [employer], was difficult to follow, and she notes how she has to travel to different counties now to try to get this taken care of.  The details were not clear.    Patient Active Problem List   Diagnosis Date Noted  . Pure hypercholesterolemia 06/01/2019  . IFG (impaired fasting glucose) 06/01/2019  . Hypokalemia 06/01/2019  . Gastroesophageal reflux disease with esophagitis 06/01/2019  . Myalgia 06/01/2019  . Hyperlipidemia 03/02/2019  . Essential hypertension 02/27/2019  . Class 2 severe obesity due to excess calories with serious comorbidity and body mass index (BMI) of 35.0 to 35.9 in adult ([place]) 02/27/2019  . Chronic bilateral low back pain without sciatica 02/27/2019      Current Outpatient Medications:  .  amLODipine (NORVASC) 10 MG tablet, Take 1 tablet (10 mg total) by mouth daily., Disp: 30 tablet, Rfl: 0 .  etodolac (LODINE) 500 MG tablet, Take 1 tablet (500 mg total) by mouth 2 (two) times daily as needed (for pain)., Disp: 90 tablet, Rfl: 0 .  omeprazole (PRILOSEC) 20 MG capsule, Take 1 capsule (20 mg total) by mouth 2 (two) times daily before a meal., Disp: 30 capsule, Rfl: 0 .  potassium chloride (KLOR-CON) 10 MEQ tablet, Take 1 tablet (10 mEq total) by mouth daily., Disp: 30 tablet, Rfl: 0 .  cyclobenzaprine (FLEXERIL) 10 MG tablet, Take 1 tablet (10 mg total) by mouth 3 (three) times daily. (Patient not taking: Reported on 01/26/2020), Disp: 14 tablet,  Rfl: 0   No Known Allergies   History reviewed. No pertinent surgical history.   History reviewed. No pertinent family history.   Social History   Tobacco Use  . Smoking status: Former Smoker    Types: Cigarettes  . Smokeless tobacco: Never Used  Substance Use Topics  . Alcohol use: Yes    Comment: occ    With staff assistance, above reviewed with the patient today.  ROS: As per HPI, otherwise no specific complaints on a limited and focused system review   No results found for this or any previous visit (from the past 72 hour(s)).   PHQ2/9: Depression screen [facility] 2/9 01/26/2020 06/04/2019 06/01/2019 02/27/2019  Decreased Interest 0 0 0 0  Down, Depressed, Hopeless 0 0 0 0  PHQ - 2 Score 0 0 0 0  Altered sleeping 0 - 0 0  Tired, decreased energy 0 - 0 0  Change in appetite 3 - 0 0  Feeling bad or failure about yourself  0 - 0 0  Trouble concentrating 0 - 0 0  Moving slowly or fidgety/restless 0 - 0 0  Suicidal thoughts 0 - 0 0  PHQ-9 Score 3 - 0 0  Difficult doing work/chores Not difficult at all - Not difficult at all Not difficult at all   PHQ-2/9 Result reviewed  Fall Risk: Fall Risk  01/26/2020 06/01/2019 02/27/2019  Falls in the past year? 1 1 0  Number falls in past yr: 0 1 0  Injury with Fall? 0 0 0  Follow up - Falls evaluation completed Falls evaluation completed      Objective:   Vitals:   01/26/20 1330  BP: 132/80  Pulse: 93  Resp: 16  Temp: (!) 97.1 F (36.2 C)  TempSrc: Temporal  SpO2: 96%  Weight: 163 lb 6.4 oz (74.1 kg)  Height: '4\' 11"'  (1.499 m)    Body mass index is 33 kg/m.  Physical Exam   NAD, masked  HEENT - [place]/AT, sclera anicteric, PERRL, EOMI, conj - non-inj'ed, pharynx clear Neck - supple, no adenopathy, carotids 2+ and = without bruits bilat Car - RRR without m/g/r Pulm- RR and effort normal at rest, CTA without wheeze or rales Abd - soft, obese, mildly tender more diffusely across the upper quadrants, lesser mid epigastric  region, with no rebound or guarding present.  ND, BS+,  no masses, no obvious HSM, not markedly tender over the lower left rib cage where she intermittently feels that discomfort. Back - no focal CVA tenderness, more diffuse tenderness of low back musculature noted when laying supine, not problematic when sitting upright. Ext - no marked LE edema,  Neuro/psychiatric - affect was not flat, appropriate with conversation  Alert,   Grossly non-focal - good strength on testing extremities, with a subtle decrease strength on the left lower extremity versus the right lower extremity noted, sensation grossly intact to LT in distal extremities  Speech normal   Results for orders placed or performed in visit on 78/58/85  BASIC METABOLIC PANEL WITH GFR  Result Value Ref Range   Glucose, Bld 97 65 - 99 mg/dL   BUN 14 7 - 25 mg/dL   Creat 0.89 0.50 - 0.99 mg/dL   GFR, Est Non African American 70 > OR = 60 mL/min/1.28m   GFR, Est African American 82 > OR = 60 mL/min/1.750m  BUN/Creatinine Ratio NOT APPLICABLE 6 - 22 (calc)   Sodium 140 135 - 146 mmol/L   Potassium 4.2 3.5 - 5.3 mmol/L   Chloride 104 98 - 110 mmol/L   CO2 27 20 - 32 mmol/L   Calcium 9.7 8.6 - 10.4 mg/dL  CK (Creatine Kinase)  Result Value Ref Range   Total CK 78 29 - 143 U/L       Assessment & Plan:   1. Dizziness Patient was concerned when driving yesterday with the return of some dizziness, and stopped yesterday to have her blood pressure checked.  That reading was borderline high systolic, although she did note some recent stressors that could have contributed.  Her blood pressure checked today on a follow-up visit was good.  She does not check her blood pressures at home presently. She remains on amlodipine for blood pressure management, although she increase this some on her own, noted she was feeling better after that increase, although has recently decreased it again a couple weeks ago.  Ideally, would get more data points  to see how well controlled it is on the 10 mg dose.  The blood pressure reading today in the office was good. We will continue the amlodipine 10 mg daily presently, try to get some periodic checks outside of the office if possible, and arrange for follow-up visits to help reassess. Very concerned with any likelihood of that given her insurance status and prior noncompliance with attempted follow-ups.  2. Essential hypertension As above noted  3. Class 1 obesity due to excess calories with serious comorbidity and body mass index (BMI) of 33.0 to 33.9 in adult She noted unable to eat a healthy diet given the limitations of her social arrangements.  4. Chronic bilateral low back pain without sciatica She has a long history of this, with some left sided symptoms in the leg that are not classic for sciatica, but possibly related.  Takes Lodine to help, Flexeril as needed.  EmRaquel Sarnaried to refer her previously, although limited with her insurance status.  5. Pure hypercholesterolemia Has not been on a  statin, and was hesitant to add in the past due to her myalgias.  Do feel would be helpful from her cholesterol status.  Again, trying to eat a healthy diet is limited.  Ideally would try to check another lipid panel again in the near future.  6. Myalgia Exact source unclear, although noted concerns with her being on a potassium supplement when not taking Lasix with any regularity.  Noted do need to check some labs including a kidney function and potassium level to help assess.  Also a thyroid lab to check would be important to help as well.  7. History of hypokalemia As noted above  8. Encounter for screening involving social determinants of health (SDoH) When I asked about checking labs, she noted was not sure could with her insurance status, and had obvious concerns with cost.  Also concerned with her situation where she is residing, and did discuss getting social work involved more acutely to help  assess.  The story with Social Security, potential fraud, concerns with her recent travels to [employer] offices all difficult to follow, but did note how it is concerning with how it is affecting her health.  9. Gastroesophageal reflux disease without esophagitis She noted the once a day omeprazole is not as effective as the twice a day omeprazole since they changed 2 weeks ago, with her symptoms worsening since that change.  I do feel the stresses are contributing.  I did feel getting laboratory tests would be helpful to start, including a CBC, comprehensive panel, amylase and lipase with her pancreatic concerns and history of pancreatitis, a thyroid panel or a TSH at a minimum, at some point another lipid panel would be helpful, I do not feel repeating his CK would be needed today with one done previous when she was symptomatic normal.  Also there was a question of impaired fasting glucose in her past, and checking an A1c would be helpful given her urinary frequency noted.  Also a urinalysis.  I informed her I needed to check with my administrator to see about the potential of getting some of these tests done, and potentially other help as needed. The administrator noted that she would talk to her after our discussion, and I informed the patient that she will be in to talk to her shortly.  I talked to the administrator after she met with the patient, and the patient was referred to the [facility] with help from our administrative as that was felt to be the best option for her presently.  Labs were not ordered from this visit, nor were medications refilled.      Towanda Malkin, MD 01/26/20 2:11 PM

## 2020-03-25 ENCOUNTER — Other Ambulatory Visit: Payer: Self-pay

## 2020-03-25 ENCOUNTER — Encounter: Payer: Self-pay | Admitting: Emergency Medicine

## 2020-03-25 DIAGNOSIS — I10 Essential (primary) hypertension: Secondary | ICD-10-CM | POA: Insufficient documentation

## 2020-03-25 DIAGNOSIS — Z87891 Personal history of nicotine dependence: Secondary | ICD-10-CM | POA: Insufficient documentation

## 2020-03-25 DIAGNOSIS — Z79899 Other long term (current) drug therapy: Secondary | ICD-10-CM | POA: Insufficient documentation

## 2020-03-25 DIAGNOSIS — N200 Calculus of kidney: Secondary | ICD-10-CM | POA: Insufficient documentation

## 2020-03-25 DIAGNOSIS — K529 Noninfective gastroenteritis and colitis, unspecified: Secondary | ICD-10-CM | POA: Insufficient documentation

## 2020-03-25 NOTE — ED Triage Notes (Signed)
Pt arrives POV to triage with c/o rectal bleeding this afternoon. Pt reports hx of pancreatitis and HTN. Pt states that she has been seeing bright red blood x three stool occurences. Pt has multiple medical complaints but is in NAD.

## 2020-03-26 ENCOUNTER — Emergency Department: Payer: Medicaid Other

## 2020-03-26 ENCOUNTER — Emergency Department
Admission: EM | Admit: 2020-03-26 | Discharge: 2020-03-26 | Disposition: A | Discharge status: Home / Self Care | Payer: Medicaid Other | Attending: Emergency Medicine | Admitting: Emergency Medicine

## 2020-03-26 DIAGNOSIS — K529 Noninfective gastroenteritis and colitis, unspecified: Secondary | ICD-10-CM

## 2020-03-26 DIAGNOSIS — N200 Calculus of kidney: Secondary | ICD-10-CM

## 2020-03-26 LAB — PROTIME-INR
INR: 1 (ref 0.8–1.2)
Prothrombin Time: 12.3 seconds (ref 11.4–15.2)

## 2020-03-26 LAB — TYPE AND SCREEN
ABO/RH(D): O POS
Antibody Screen: NEGATIVE

## 2020-03-26 LAB — COMPREHENSIVE METABOLIC PANEL
ALT: 31 U/L (ref 0–44)
AST: 26 U/L (ref 15–41)
Albumin: 4.4 g/dL (ref 3.5–5.0)
Alkaline Phosphatase: 83 U/L (ref 38–126)
Anion gap: 10 (ref 5–15)
BUN: 17 mg/dL (ref 8–23)
CO2: 24 mmol/L (ref 22–32)
Calcium: 9.5 mg/dL (ref 8.9–10.3)
Chloride: 104 mmol/L (ref 98–111)
Creatinine, Ser: 0.93 mg/dL (ref 0.44–1.00)
GFR calc Af Amer: >60 mL/min (ref >60)
GFR calc non Af Amer: >60 mL/min (ref >60)
Glucose, Bld: 109 mg/dL — ABNORMAL HIGH (ref 70–99)
Potassium: 3.5 mmol/L (ref 3.5–5.1)
Sodium: 138 mmol/L (ref 135–145)
Total Bilirubin: 0.8 mg/dL (ref 0.3–1.2)
Total Protein: 7.4 g/dL (ref 6.5–8.1)

## 2020-03-26 LAB — CBC
HCT: 38.3 % (ref 36.0–46.0)
Hemoglobin: 12.7 g/dL (ref 12.0–15.0)
MCH: 31.4 pg (ref 26.0–34.0)
MCHC: 33.2 g/dL (ref 30.0–36.0)
MCV: 94.8 fL (ref 80.0–100.0)
Platelets: 361 10*3/uL (ref 150–400)
RBC: 4.04 MIL/uL (ref 3.87–5.11)
RDW: 12.7 % (ref 11.5–15.5)
WBC: 9.2 10*3/uL (ref 4.0–10.5)
nRBC: 0 % (ref 0.0–0.2)

## 2020-03-26 LAB — URINALYSIS, COMPLETE (UACMP) WITH MICROSCOPIC
Bacteria, UA: NONE SEEN
Bilirubin Urine: NEGATIVE
Glucose, UA: NEGATIVE mg/dL
Hgb urine dipstick: NEGATIVE
Ketones, ur: NEGATIVE mg/dL
Leukocytes,Ua: NEGATIVE
Nitrite: NEGATIVE
Protein, ur: NEGATIVE mg/dL
Specific Gravity, Urine: 1.024 (ref 1.005–1.030)
pH: 7 (ref 5.0–8.0)

## 2020-03-26 LAB — LIPASE, BLOOD: Lipase: 27 U/L (ref 11–51)

## 2020-03-26 LAB — ETHANOL: Alcohol, Ethyl (B): <10 mg/dL (ref <10)

## 2020-03-26 MED ORDER — ONDANSETRON 4 MG PO TBDP: 4.0000 mg | ORAL_TABLET | Freq: Three times a day (TID) | ORAL | 0 refills | Status: DC | PRN

## 2020-03-26 MED ORDER — IOHEXOL 300 MG/ML  SOLN
100.0000 mL | Freq: Once | INTRAMUSCULAR | Status: AC | PRN
  Administered 2020-03-26: 100 mL via INTRAVENOUS

## 2020-03-26 MED ORDER — ONDANSETRON HCL 4 MG/2ML IJ SOLN
4.0000 mg | Freq: Once | INTRAMUSCULAR | Status: AC
  Administered 2020-03-26: 4 mg via INTRAVENOUS
  Filled 2020-03-26: qty 2

## 2020-03-26 MED ORDER — SODIUM CHLORIDE 0.9 % IV BOLUS
1000.0000 mL | Freq: Once | INTRAVENOUS | Status: AC
  Administered 2020-03-26: 1000 mL via INTRAVENOUS

## 2020-03-26 MED ORDER — FENTANYL CITRATE (PF) 100 MCG/2ML IJ SOLN
50.0000 ug | Freq: Once | INTRAMUSCULAR | Status: AC
  Administered 2020-03-26: 50 ug via INTRAVENOUS
  Filled 2020-03-26: qty 2

## 2020-03-26 MED ORDER — IOHEXOL 9 MG/ML PO SOLN
500.0000 mL | ORAL | Status: AC
  Administered 2020-03-26: 500 mL via ORAL

## 2020-03-26 NOTE — ED Provider Notes (Signed)
[facility] Emergency Department Provider Note   ____________________________________________   First MD Initiated Contact with Patient 03/26/20 [phone]     (approximate)  I have reviewed the triage vital signs and the nursing notes.   HISTORY  Chief Complaint Rectal Bleeding    HPI Pamela Hanson is a 64 y.o. female with medical history of hypertension, hyperlipidemia, GERD, and pancreatitis who presents to the ED complaining of abdominal pain.  Patient reports she has had 1 to 2 weeks of intermittent pain primarily in the left upper quadrant of her abdomen.  This is been associated with nausea, vomiting, and diarrhea, which has become more consistent over the past few days.  Yesterday she noticed some small streaks of bright red blood in her stool but when she had a bowel movement this morning this had resolved.  She has not noticed any dark tarry stool.  She denies any fevers, chills, cough, chest pain, shortness of breath, dysuria, or hematuria.  She reports that her nausea has improved following dose of Zofran.        Past Medical History:  Diagnosis Date  . Degenerative disc disease, lumbar   . Hypertension   . Pancreatitis     Patient Active Problem List   Diagnosis Date Noted  . History of hypokalemia 01/26/2020  . Gastroesophageal reflux disease without esophagitis 01/26/2020  . Pure hypercholesterolemia 06/01/2019  . IFG (impaired fasting glucose) 06/01/2019  . Myalgia 06/01/2019  . Hyperlipidemia 03/02/2019  . Essential hypertension 02/27/2019  . Class 1 obesity due to excess calories with serious comorbidity and body mass index (BMI) of 33.0 to 33.9 in adult 02/27/2019  . Chronic bilateral low back pain without sciatica 02/27/2019    Past Surgical History:  Procedure Laterality Date  . CHOLECYSTECTOMY      Prior to Admission medications   Medication Sig Start Date End Date Taking? Authorizing Provider  amLODipine (NORVASC) 10 MG  tablet Take 1 tablet (10 mg total) by mouth daily. 01/07/20   Doren Custard, FNP  cyclobenzaprine (FLEXERIL) 10 MG tablet Take 1 tablet (10 mg total) by mouth 3 (three) times daily. Patient not taking: Reported on 01/26/2020 08/31/19   Doren Custard, FNP  etodolac (LODINE) 500 MG tablet Take 1 tablet (500 mg total) by mouth 2 (two) times daily as needed (for pain). 01/07/20   Doren Custard, FNP  omeprazole (PRILOSEC) 20 MG capsule Take 1 capsule (20 mg total) by mouth 2 (two) times daily before a meal. 01/07/20   Doren Custard, FNP  ondansetron (ZOFRAN ODT) 4 MG disintegrating tablet Take 1 tablet (4 mg total) by mouth every 8 (eight) hours as needed for nausea or vomiting. 03/26/20   Chesley Noon, MD  potassium chloride (KLOR-CON) 10 MEQ tablet Take 1 tablet (10 mEq total) by mouth daily. 01/07/20   Doren Custard, FNP    Allergies Patient has no known allergies.  No family history on file.  Social History Social History   Tobacco Use  . Smoking status: Former Smoker    Types: Cigarettes  . Smokeless tobacco: Never Used  Substance Use Topics  . Alcohol use: Yes    Comment: occ  . Drug use: Yes    Types: Marijuana    Review of Systems  Constitutional: No fever/chills Eyes: No visual changes. ENT: No sore throat. Cardiovascular: Denies chest pain. Respiratory: Denies shortness of breath. Gastrointestinal: Positive for abdominal pain, nausea, vomiting, and diarrhea.  No constipation. Genitourinary: Negative for  dysuria. Musculoskeletal: Negative for back pain. Skin: Negative for rash. Neurological: Negative for headaches, focal weakness or numbness.  ____________________________________________   PHYSICAL EXAM:  VITAL SIGNS: ED Triage Vitals  Enc Vitals Group     BP 03/25/20 2348 (!) 151/80     Pulse Rate 03/25/20 2348 74     Resp 03/25/20 2348 18     Temp 03/25/20 2348 98.2 F (36.8 C)     Temp Source 03/25/20 2348 Oral     SpO2 03/25/20 2348 97 %     Weight  03/25/20 2345 160 lb (72.6 kg)     Height 03/25/20 2345 4\' 11"  (1.499 m)     Head Circumference --      Peak Flow --      Pain Score 03/25/20 2344 9     Pain Loc --      Pain Edu? --      Excl. in [place]? --     Constitutional: Alert and oriented. Eyes: Conjunctivae are normal. Head: Atraumatic. Nose: No congestion/rhinnorhea. Mouth/Throat: Mucous membranes are moist. Neck: Normal ROM Cardiovascular: Normal rate, regular rhythm. Grossly normal heart sounds. Respiratory: Normal respiratory effort.  No retractions. Lungs CTAB. Gastrointestinal: Soft and nontender. No distention. Genitourinary: deferred Musculoskeletal: No lower extremity tenderness nor edema. Neurologic:  Normal speech and language. No gross focal neurologic deficits are appreciated. Skin:  Skin is warm, dry and intact. No rash noted. Psychiatric: Mood and affect are normal. Speech and behavior are normal.  ____________________________________________   LABS (all labs ordered are listed, but only abnormal results are displayed)  Labs Reviewed  COMPREHENSIVE METABOLIC PANEL - Abnormal; Notable for the following components:      Result Value   Glucose, Bld 109 (*)    All other components within normal limits  URINALYSIS, COMPLETE (UACMP) WITH MICROSCOPIC - Abnormal; Notable for the following components:   Color, Urine COLORLESS (*)    APPearance CLEAR (*)    All other components within normal limits  CBC  ETHANOL  LIPASE, BLOOD  PROTIME-INR  POC OCCULT BLOOD, ED  TYPE AND SCREEN    PROCEDURES  Procedure(s) performed (including Critical Care):  Procedures   ____________________________________________   INITIAL IMPRESSION / ASSESSMENT AND PLAN / ED COURSE       64 year old female with past medical history of hypertension, hyperlipidemia, GERD, and pancreatitis presents to the ED complaining of intermittent abdominal pain over the past couple weeks associated with vomiting and diarrhea.  She appears  overall well with no significant tenderness on abdominal exam.  Lab work is also reassuring, LFTs and lipase within normal limits.  There is no evidence of significant GI bleeding given her stable blood counts, bleeding also seems to have resolved this morning, she is not anticoagulated.  She is awaiting CT scan ordered from triage and if this were to be unremarkable, she would be appropriate for discharge home with PCP follow-up.  Her symptoms seem to have improved following dose of fentanyl and Zofran.  CT scan shows partially obstructing right ureterolithiasis, unlikely to be causing patient's symptoms as all of her pain is on the left. Pain has resolved at this time and she is able to tolerate PO. She was provided with Urology follow-up and counseled to return to the ED for any new or worsening symptoms. Patient agrees with plan.      ____________________________________________   FINAL CLINICAL IMPRESSION(S) / ED DIAGNOSES  Final diagnoses:  Gastroenteritis  Nephrolithiasis     ED Discharge Orders  Ordered    ondansetron (ZOFRAN ODT) 4 MG disintegrating tablet  Every 8 hours PRN     03/26/20 1006           Note:  This document was prepared using Dragon voice recognition software and may include unintentional dictation errors.   Chesley Noon, MD 03/26/20 1323

## 2020-03-26 NOTE — ED Notes (Signed)
Pt states she had a BM that was soft and a yellowish color, but denies any red in stool.

## 2020-03-26 NOTE — ED Notes (Signed)
Repositioned pt and dimmed lights per request.

## 2020-03-26 NOTE — ED Notes (Signed)
CT at bedside now, pt trx to CT

## 2020-03-26 NOTE — ED Notes (Signed)
Pt took prescribed Etodolac for pain. Consulted w/ EDP prior to pt taking.

## 2020-03-26 NOTE — ED Notes (Signed)
Pt states coming to the ER after having 3 bright red stool movements yesterday. Pt states no current Hemroids. Pt resting in room on BP, cardiac and pulse ox monitor. Pt states intermittent pain to the area under the left breast and states that the pain is similar to when she had pancreatitis.  Pt denies current pain. Pt states her stool has been lose. Pt states since coming to the ER, no more bowel movements, but had 1 episode of vomiting.

## 2020-03-26 NOTE — Discharge Instructions (Signed)
Your symptoms today are likely caused by a viral illness causing a GI bug, also known as viral gastroenteritis.  You may take the Zofran that was prescribed for nausea or vomiting, you may also take over-the-counter loperamide (brand name Imodium) for diarrhea.  Please schedule follow-up with your PCP for reevaluation.  Your CT scan today also showed a kidney stone on the right.  This is unlikely to be the cause of your pain but it is possible you will develop pain in your right flank due to this.  You can schedule follow-up with Dr. Annabell Howells of urology and take over-the-counter medications as needed for pain.  Please return to the ER for any new or worsening symptoms.

## 2020-03-26 NOTE — ED Notes (Signed)
Patient transported to CT 

## 2020-03-27 ENCOUNTER — Emergency Department
Admission: EM | Admit: 2020-03-27 | Discharge: 2020-03-27 | Disposition: A | Discharge status: Home / Self Care | Payer: Medicaid Other | Attending: Emergency Medicine | Admitting: Emergency Medicine

## 2020-03-27 ENCOUNTER — Other Ambulatory Visit: Payer: Self-pay

## 2020-03-27 DIAGNOSIS — Z87891 Personal history of nicotine dependence: Secondary | ICD-10-CM | POA: Insufficient documentation

## 2020-03-27 DIAGNOSIS — K529 Noninfective gastroenteritis and colitis, unspecified: Secondary | ICD-10-CM | POA: Insufficient documentation

## 2020-03-27 DIAGNOSIS — I10 Essential (primary) hypertension: Secondary | ICD-10-CM | POA: Insufficient documentation

## 2020-03-27 DIAGNOSIS — Z20822 Contact with and (suspected) exposure to covid-19: Secondary | ICD-10-CM | POA: Insufficient documentation

## 2020-03-27 DIAGNOSIS — A084 Viral intestinal infection, unspecified: Secondary | ICD-10-CM

## 2020-03-27 DIAGNOSIS — Z79899 Other long term (current) drug therapy: Secondary | ICD-10-CM | POA: Insufficient documentation

## 2020-03-27 LAB — URINALYSIS, COMPLETE (UACMP) WITH MICROSCOPIC
Bacteria, UA: NONE SEEN
Glucose, UA: NEGATIVE mg/dL
Hgb urine dipstick: NEGATIVE
Ketones, ur: NEGATIVE mg/dL
Leukocytes,Ua: NEGATIVE
Nitrite: NEGATIVE
Protein, ur: 30 mg/dL — AB
Specific Gravity, Urine: 1.025 (ref 1.005–1.030)
pH: 5 (ref 5.0–8.0)

## 2020-03-27 LAB — COMPREHENSIVE METABOLIC PANEL
ALT: 25 U/L (ref 0–44)
AST: 22 U/L (ref 15–41)
Albumin: 4.1 g/dL (ref 3.5–5.0)
Alkaline Phosphatase: 82 U/L (ref 38–126)
Anion gap: 8 (ref 5–15)
BUN: 11 mg/dL (ref 8–23)
CO2: 25 mmol/L (ref 22–32)
Calcium: 9 mg/dL (ref 8.9–10.3)
Chloride: 107 mmol/L (ref 98–111)
Creatinine, Ser: 0.8 mg/dL (ref 0.44–1.00)
GFR calc Af Amer: >60 mL/min (ref >60)
GFR calc non Af Amer: >60 mL/min (ref >60)
Glucose, Bld: 107 mg/dL — ABNORMAL HIGH (ref 70–99)
Potassium: 3.6 mmol/L (ref 3.5–5.1)
Sodium: 140 mmol/L (ref 135–145)
Total Bilirubin: 0.9 mg/dL (ref 0.3–1.2)
Total Protein: 7.3 g/dL (ref 6.5–8.1)

## 2020-03-27 LAB — CBC
HCT: 38.2 % (ref 36.0–46.0)
Hemoglobin: 12.7 g/dL (ref 12.0–15.0)
MCH: 31.5 pg (ref 26.0–34.0)
MCHC: 33.2 g/dL (ref 30.0–36.0)
MCV: 94.8 fL (ref 80.0–100.0)
Platelets: 361 10*3/uL (ref 150–400)
RBC: 4.03 MIL/uL (ref 3.87–5.11)
RDW: 12.5 % (ref 11.5–15.5)
WBC: 8.5 10*3/uL (ref 4.0–10.5)
nRBC: 0 % (ref 0.0–0.2)

## 2020-03-27 LAB — SARS CORONAVIRUS 2 BY RT PCR (HOSPITAL ORDER, PERFORMED IN ~~LOC~~ HOSPITAL LAB): SARS Coronavirus 2: NEGATIVE

## 2020-03-27 LAB — LIPASE, BLOOD: Lipase: 32 U/L (ref 11–51)

## 2020-03-27 MED ORDER — LACTATED RINGERS IV BOLUS
1000.0000 mL | Freq: Once | INTRAVENOUS | Status: AC
  Administered 2020-03-27: 1000 mL via INTRAVENOUS

## 2020-03-27 MED ORDER — ONDANSETRON HCL 4 MG/2ML IJ SOLN
4.0000 mg | Freq: Once | INTRAMUSCULAR | Status: AC
  Administered 2020-03-27: 4 mg via INTRAVENOUS
  Filled 2020-03-27: qty 2

## 2020-03-27 MED ORDER — ONDANSETRON 4 MG PO TBDP: 4.0000 mg | ORAL_TABLET | Freq: Three times a day (TID) | ORAL | 0 refills | Status: DC | PRN

## 2020-03-27 MED ORDER — IBUPROFEN 600 MG PO TABS
600.0000 mg | ORAL_TABLET | Freq: Four times a day (QID) | ORAL | 0 refills | Status: DC | PRN
Start: 2020-03-27 — End: 2020-10-25

## 2020-03-27 MED ORDER — KETOROLAC TROMETHAMINE 30 MG/ML IJ SOLN
15.0000 mg | Freq: Once | INTRAMUSCULAR | Status: AC
  Administered 2020-03-27: 15 mg via INTRAVENOUS
  Filled 2020-03-27: qty 1

## 2020-03-27 NOTE — ED Triage Notes (Signed)
Patient c/o upper abdominal pain and emesis. Patient seen yesterday for same.

## 2020-03-27 NOTE — ED Provider Notes (Signed)
[facility] Emergency Department Provider Note  ____________________________________________  Time seen: Approximately 6:10 AM  I have reviewed the triage vital signs and the nursing notes.   HISTORY  Chief Complaint Abdominal Pain   HPI Pamela Hanson is a 64 y.o. female with a history of hypertension, pancreatitis, hyperlipidemia, GERD who presents for evaluation of abdominal pain, vomiting, and diarrhea.   Her symptoms have been ongoing for the last 3 days.  Patient describes the pain is dull located in the left side of her abdomen, constant nonradiating associated with several episodes of nonbloody nonbilious emesis and watery diarrhea.  Diarrhea is subsiding.  No fever or chills, no chest pain or shortness of breath.  She does report mild congestion which she attributes to allergies.  No known exposures to Covid.  No Covid vaccination.  No dysuria or hematuria.  Patient was seen here yesterday for the same symptoms and had CT of her abdomen showing a right-sided ureteral stone.  Patient denies any right-sided abdominal pain.  She continues to have nausea and vomiting today which prompted her visit back to the emergency room.  She reports that the pain is similar to her prior episode of pancreatitis.  Past Medical History:  Diagnosis Date  . Degenerative disc disease, lumbar   . Hypertension   . Pancreatitis     Patient Active Problem List   Diagnosis Date Noted  . History of hypokalemia 01/26/2020  . Gastroesophageal reflux disease without esophagitis 01/26/2020  . Pure hypercholesterolemia 06/01/2019  . IFG (impaired fasting glucose) 06/01/2019  . Myalgia 06/01/2019  . Hyperlipidemia 03/02/2019  . Essential hypertension 02/27/2019  . Class 1 obesity due to excess calories with serious comorbidity and body mass index (BMI) of 33.0 to 33.9 in adult 02/27/2019  . Chronic bilateral low back pain without sciatica 02/27/2019    Past Surgical History:   Procedure Laterality Date  . CHOLECYSTECTOMY      Prior to Admission medications   Medication Sig Start Date End Date Taking? Authorizing Provider  amLODipine (NORVASC) 10 MG tablet Take 1 tablet (10 mg total) by mouth daily. 01/07/20   Hubbard Hartshorn, FNP  cyclobenzaprine (FLEXERIL) 10 MG tablet Take 1 tablet (10 mg total) by mouth 3 (three) times daily. Patient not taking: Reported on 01/26/2020 08/31/19   Hubbard Hartshorn, FNP  etodolac (LODINE) 500 MG tablet Take 1 tablet (500 mg total) by mouth 2 (two) times daily as needed (for pain). 01/07/20   Hubbard Hartshorn, FNP  ibuprofen (ADVIL) 600 MG tablet Take 1 tablet (600 mg total) by mouth every 6 (six) hours as needed. 03/27/20   Rudene Re, MD  omeprazole (PRILOSEC) 20 MG capsule Take 1 capsule (20 mg total) by mouth 2 (two) times daily before a meal. 01/07/20   Hubbard Hartshorn, FNP  ondansetron (ZOFRAN ODT) 4 MG disintegrating tablet Take 1 tablet (4 mg total) by mouth every 8 (eight) hours as needed for nausea or vomiting. 03/26/20   Blake Divine, MD  ondansetron (ZOFRAN ODT) 4 MG disintegrating tablet Take 1 tablet (4 mg total) by mouth every 8 (eight) hours as needed. 03/27/20   Rudene Re, MD  potassium chloride (KLOR-CON) 10 MEQ tablet Take 1 tablet (10 mEq total) by mouth daily. 01/07/20   Hubbard Hartshorn, FNP    Allergies Patient has no known allergies.  No family history on file.  Social History Social History   Tobacco Use  . Smoking status: Former Smoker  Types: Cigarettes  . Smokeless tobacco: Never Used  Substance Use Topics  . Alcohol use: Yes    Comment: occ  . Drug use: Yes    Types: Marijuana    Review of Systems  Constitutional: Negative for fever. Eyes: Negative for visual changes. ENT: Negative for sore throat. Neck: No neck pain  Cardiovascular: Negative for chest pain. Respiratory: Negative for shortness of breath. Gastrointestinal: + abdominal pain, vomiting and diarrhea. Genitourinary:  Negative for dysuria. Musculoskeletal: Negative for back pain. Skin: Negative for rash. Neurological: Negative for headaches, weakness or numbness. Psych: No SI or HI  ____________________________________________   PHYSICAL EXAM:  VITAL SIGNS: ED Triage Vitals  Enc Vitals Group     BP 03/27/20 0010 (!) 155/89     Pulse Rate 03/27/20 0010 72     Resp 03/27/20 0010 18     Temp 03/27/20 0010 98.2 F (36.8 C)     Temp src --      SpO2 03/27/20 0010 94 %     Weight 03/27/20 0009 160 lb (72.6 kg)     Height 03/27/20 0009 4' 11.5" (1.511 m)     Head Circumference --      Peak Flow --      Pain Score 03/27/20 0009 6     Pain Loc --      Pain Edu? --      Excl. in GC? --     Constitutional: Alert and oriented. Well appearing and in no apparent distress. HEENT:      Head: Normocephalic and atraumatic.         Eyes: Conjunctivae are normal. Sclera is non-icteric.       Mouth/Throat: Mucous membranes are moist.       Neck: Supple with no signs of meningismus. Cardiovascular: Regular rate and rhythm. No murmurs, gallops, or rubs.  Respiratory: Normal respiratory effort. Lungs are clear to auscultation bilaterally. No wheezes, crackles, or rhonchi.  Gastrointestinal: Soft, non tender, and non distended with positive bowel sounds. No rebound or guarding. Genitourinary: No CVA tenderness. Musculoskeletal: No edema, cyanosis, or erythema of extremities. Neurologic: Normal speech and language. Face is symmetric. Moving all extremities. No gross focal neurologic deficits are appreciated. Skin: Skin is warm, dry and intact. No rash noted. Psychiatric: Mood and affect are normal. Speech and behavior are normal.  ____________________________________________   LABS (all labs ordered are listed, but only abnormal results are displayed)  Labs Reviewed  COMPREHENSIVE METABOLIC PANEL - Abnormal; Notable for the following components:      Result Value   Glucose, Bld 107 (*)    All other  components within normal limits  URINALYSIS, COMPLETE (UACMP) WITH MICROSCOPIC - Abnormal; Notable for the following components:   Color, Urine YELLOW (*)    APPearance HAZY (*)    Bilirubin Urine MODERATE (*)    Protein, ur 30 (*)    All other components within normal limits  SARS CORONAVIRUS 2 BY RT PCR (HOSPITAL ORDER, PERFORMED IN [facility])  LIPASE, BLOOD  CBC   ____________________________________________  EKG  ED ECG REPORT I, Nita Sickle, the attending physician, personally viewed and interpreted this ECG.  Normal sinus rhythm, rate of 61, normal intervals, normal axis, no ST elevations or depressions.  Normal EKG. ____________________________________________  RADIOLOGY  I have personally reviewed the images performed during this visit and I agree with the Radiologist's read.   Interpretation by Radiologist:  CT Abdomen Pelvis W Contrast  Result Date: 03/26/2020 CLINICAL DATA:  RIGHT red blood per rectum.  Bloody stool EXAM: CT ABDOMEN AND PELVIS WITH CONTRAST TECHNIQUE: Multidetector CT imaging of the abdomen and pelvis was performed using the standard protocol following bolus administration of intravenous contrast. CONTRAST:  OMNIPAQUE IOHEXOL 300 MG/ML  SOLN COMPARISON:  CT abdomen 06/09/2007 FINDINGS: Lower chest: Lung bases are clear. Hepatobiliary: No focal hepatic lesion. Postcholecystectomy. No biliary dilatation. Pancreas: Pancreas is normal. No ductal dilatation. No pancreatic inflammation. Spleen: Normal spleen Adrenals/urinary tract: Round enlargement of the LEFT adrenal gland to 2.8 cm. Gland is low density (HU equal 2.7) on the delayed post-contrast imaging consistent with benign adenoma. LEFT adrenal gland normal. Mild hydroureter on the RIGHT. There is obstructing calculus in the distal RIGHT ureter at the vesicoureteral junction. This calculus measures 4 mm on image 72/2 Two additional nonobstructing RIGHT renal calculi are present  measuring 3 mm and 5 mm. No bladder calculi. Stomach/Bowel: Small hiatal hernia. Stomach, small-bowel and cecum are normal. The appendix is not identified but there is no pericecal inflammation to suggest appendicitis. Ascending and transverse colon normal. Descending colon normal. No diverticulosis. No mass lesion or obstruction. Vascular/Lymphatic: Abdominal aorta is normal caliber with atherosclerotic calcification. There is no retroperitoneal or periportal lymphadenopathy. No pelvic lymphadenopathy. Reproductive: Uterus and adnexa unremarkable. Other: No free fluid. Musculoskeletal: No aggressive osseous lesion. IMPRESSION: 1. Partially obstructing calculus in the distal RIGHT ureter at the RIGHT vesicoureteral junction. 2. RIGHT nephrolithiasis. 3. No etiology for gastrointestinal bleeding identified. Electronically Signed   By: Genevive Bi M.D.   On: 03/26/2020 08:09     ____________________________________________   PROCEDURES  Procedure(s) performed:yes .1-3 Lead EKG Interpretation Performed by: Nita Sickle, MD Authorized by: Nita Sickle, MD     Interpretation: normal     ECG rate assessment: normal     Rhythm: sinus rhythm     Ectopy: none     Conduction: normal     Critical Care performed:  None ____________________________________________   INITIAL IMPRESSION / ASSESSMENT AND PLAN / ED COURSE  64 y.o. female with a history of hypertension, pancreatitis, hyperlipidemia, GERD who presents for evaluation of abdominal pain, vomiting, and diarrhea.   Patient was seen here yesterday for the same complaint and had a CT abdomen pelvis which showed a right ureteral stone.  Patient's pain is mostly left upper quadrant.  She continues to have nausea and vomiting.  Diarrhea is improving and only had one BM today.  No fever or chills.  Her vitals are within normal limits.  She looks well-hydrated otherwise.  Abdomen is soft and nondistended with no tenderness.  CT from  yesterday visualized by me with no acute findings other than the single stone.  EKG showing no signs of ischemia.  Lipase normal.  LFTs normal.  Electrolytes normal.  White count normal.  UA with no blood or evidence of UTI.  Most likely viral gastroenteritis.  Will check for Covid.  Will give IV Toradol, Zofran, and fluids for symptom relief.  Old medical records reviewed.  Patient placed on telemetry for close monitoring.  _________________________ 7:13 AM on 03/27/2020 -----------------------------------------  Patient received medications with improvement of her symptoms.  Covid swab negative.  Will discharge home on supportive care follow-up with primary care doctor for viral gastroenteritis.  Discussed my standard return precautions.    _____________________________________________ Please note:  Patient was evaluated in Emergency Department today for the symptoms described in the history of present illness. Patient was evaluated in the context of the global COVID-19 pandemic, which necessitated  consideration that the patient might be at risk for infection with the SARS-CoV-2 virus that causes COVID-19. Institutional protocols and algorithms that pertain to the evaluation of patients at risk for COVID-19 are in a state of rapid change based on information released by regulatory bodies including the CDC and federal and state organizations. These policies and algorithms were followed during the patient's care in the ED.  Some ED evaluations and interventions may be delayed as a result of limited staffing during the pandemic.   [place] Controlled Substance Database was reviewed by me. ____________________________________________   FINAL CLINICAL IMPRESSION(S) / ED DIAGNOSES   Final diagnoses:  Viral gastroenteritis      NEW MEDICATIONS STARTED DURING THIS VISIT:  ED Discharge Orders         Ordered    ondansetron (ZOFRAN ODT) 4 MG disintegrating tablet  Every 8 hours PRN     03/27/20 0652     ibuprofen (ADVIL) 600 MG tablet  Every 6 hours PRN     03/27/20 8144           Note:  This document was prepared using Dragon voice recognition software and may include unintentional dictation errors.    Don Perking, [place], MD 03/27/20 [phone]

## 2020-03-27 NOTE — ED Notes (Signed)
While patient resting with eyes closed, pulse oxi drops into upper 80's, when awakened pulse oxi up to 93%.   Patient placed on O2 at 2 liters.  MD notified.

## 2020-03-27 NOTE — ED Notes (Signed)
Dr. Don Perking in with patient.

## 2020-03-27 NOTE — ED Notes (Signed)
Patient reports continued abdominal pain and diarrhea.  Reports feels similar to when she had pancreatitis.

## 2020-03-28 ENCOUNTER — Emergency Department: Payer: Self-pay

## 2020-03-28 ENCOUNTER — Other Ambulatory Visit: Payer: Self-pay

## 2020-03-28 ENCOUNTER — Encounter: Payer: Self-pay | Admitting: Emergency Medicine

## 2020-03-28 ENCOUNTER — Emergency Department
Admission: EM | Admit: 2020-03-28 | Discharge: 2020-03-28 | Disposition: A | Discharge status: Home / Self Care | Payer: Medicaid Other | Attending: Emergency Medicine | Admitting: Emergency Medicine

## 2020-03-28 DIAGNOSIS — R1030 Lower abdominal pain, unspecified: Secondary | ICD-10-CM | POA: Insufficient documentation

## 2020-03-28 DIAGNOSIS — R109 Unspecified abdominal pain: Secondary | ICD-10-CM

## 2020-03-28 DIAGNOSIS — R3912 Poor urinary stream: Secondary | ICD-10-CM | POA: Insufficient documentation

## 2020-03-28 DIAGNOSIS — K5900 Constipation, unspecified: Secondary | ICD-10-CM | POA: Insufficient documentation

## 2020-03-28 DIAGNOSIS — R252 Cramp and spasm: Secondary | ICD-10-CM | POA: Insufficient documentation

## 2020-03-28 DIAGNOSIS — M545 Low back pain: Secondary | ICD-10-CM | POA: Insufficient documentation

## 2020-03-28 DIAGNOSIS — I10 Essential (primary) hypertension: Secondary | ICD-10-CM | POA: Insufficient documentation

## 2020-03-28 LAB — COMPREHENSIVE METABOLIC PANEL
ALT: 24 U/L (ref 0–44)
AST: 27 U/L (ref 15–41)
Albumin: 4.2 g/dL (ref 3.5–5.0)
Alkaline Phosphatase: 82 U/L (ref 38–126)
Anion gap: 10 (ref 5–15)
BUN: 14 mg/dL (ref 8–23)
CO2: 26 mmol/L (ref 22–32)
Calcium: 9.7 mg/dL (ref 8.9–10.3)
Chloride: 101 mmol/L (ref 98–111)
Creatinine, Ser: 0.95 mg/dL (ref 0.44–1.00)
GFR calc Af Amer: >60 mL/min (ref >60)
GFR calc non Af Amer: >60 mL/min (ref >60)
Glucose, Bld: 114 mg/dL — ABNORMAL HIGH (ref 70–99)
Potassium: 3.3 mmol/L — ABNORMAL LOW (ref 3.5–5.1)
Sodium: 137 mmol/L (ref 135–145)
Total Bilirubin: 0.7 mg/dL (ref 0.3–1.2)
Total Protein: 7.3 g/dL (ref 6.5–8.1)

## 2020-03-28 LAB — URINALYSIS, COMPLETE (UACMP) WITH MICROSCOPIC
Bilirubin Urine: NEGATIVE
Glucose, UA: NEGATIVE mg/dL
Hgb urine dipstick: NEGATIVE
Ketones, ur: NEGATIVE mg/dL
Leukocytes,Ua: NEGATIVE
Nitrite: NEGATIVE
Protein, ur: NEGATIVE mg/dL
Specific Gravity, Urine: 1.011 (ref 1.005–1.030)
pH: 5 (ref 5.0–8.0)

## 2020-03-28 LAB — CBC
HCT: 37.3 % (ref 36.0–46.0)
Hemoglobin: 12.9 g/dL (ref 12.0–15.0)
MCH: 31.3 pg (ref 26.0–34.0)
MCHC: 34.6 g/dL (ref 30.0–36.0)
MCV: 90.5 fL (ref 80.0–100.0)
Platelets: 403 10*3/uL — ABNORMAL HIGH (ref 150–400)
RBC: 4.12 MIL/uL (ref 3.87–5.11)
RDW: 12.5 % (ref 11.5–15.5)
WBC: 10.1 10*3/uL (ref 4.0–10.5)
nRBC: 0 % (ref 0.0–0.2)

## 2020-03-28 LAB — LIPASE, BLOOD: Lipase: 35 U/L (ref 11–51)

## 2020-03-28 MED ORDER — KETOROLAC TROMETHAMINE 10 MG PO TABS
10.0000 mg | ORAL_TABLET | Freq: Four times a day (QID) | ORAL | 0 refills | Status: DC | PRN
Start: 2020-03-28 — End: 2020-10-25

## 2020-03-28 MED ORDER — SODIUM CHLORIDE 0.9% FLUSH: 3.0000 mL | Freq: Once | INTRAVENOUS | Status: DC

## 2020-03-28 MED ORDER — CYCLOBENZAPRINE HCL 10 MG PO TABS
10.0000 mg | ORAL_TABLET | Freq: Once | ORAL | Status: AC
  Administered 2020-03-28: 10 mg via ORAL
  Filled 2020-03-28: qty 1

## 2020-03-28 MED ORDER — CYCLOBENZAPRINE HCL 10 MG PO TABS
10.0000 mg | ORAL_TABLET | Freq: Three times a day (TID) | ORAL | 0 refills | Status: DC | PRN
Start: 2020-03-28 — End: 2020-10-25

## 2020-03-28 MED ORDER — OXYCODONE-ACETAMINOPHEN 5-325 MG PO TABS
2.0000 | ORAL_TABLET | Freq: Once | ORAL | Status: DC
  Filled 2020-03-28: qty 2

## 2020-03-28 MED ORDER — KETOROLAC TROMETHAMINE 60 MG/2ML IM SOLN
60.0000 mg | Freq: Once | INTRAMUSCULAR | Status: AC
  Administered 2020-03-28: 60 mg via INTRAMUSCULAR
  Filled 2020-03-28: qty 2

## 2020-03-28 MED ORDER — POLYETHYLENE GLYCOL 3350 17 G PO PACK
17.0000 g | PACK | Freq: Every day | ORAL | 0 refills | Status: DC
Start: 2020-03-28 — End: 2020-11-17

## 2020-03-28 NOTE — ED Triage Notes (Signed)
Pt presents to ED via POV with c/o lower abdominal pain that radiates to her back, also c/o cramping to BLE. Pt seen yesterday for abdominal pain as well. Pt also states her urine is now darker. Pt states was dx with kidney stone on 5/28

## 2020-03-28 NOTE — ED Provider Notes (Signed)
ER Provider Note       Time seen: 9:43 PM   I have reviewed the vital signs and the nursing notes.  HISTORY   Chief Complaint Back Pain   HPI Pamela Hanson is a 64 y.o. female with a history of degenerative disc disease, hypertension, pancreatitis who presents today for lower abdominal pain that radiates into her back.  She is also having cramping in her lower extremities.  Seen yesterday for abdominal pain as well, was diagnosed with a kidney stone.  Discomfort is 6 out of 10 in the lower abdomen.  Past Medical History:  Diagnosis Date  . Degenerative disc disease, lumbar   . Hypertension   . Pancreatitis     Past Surgical History:  Procedure Laterality Date  . CHOLECYSTECTOMY      Allergies Patient has no known allergies.   Review of Systems Constitutional: Negative for fever. Cardiovascular: Negative for chest pain. Respiratory: Negative for shortness of breath. Gastrointestinal: Positive for abdominal pain Musculoskeletal: Positive for back pain Skin: Negative for rash. Neurological: Negative for headaches, focal weakness or numbness.  All systems negative/normal/unremarkable except as stated in the HPI  ____________________________________________   PHYSICAL EXAM:  VITAL SIGNS: Vitals:   03/28/20 1713  BP: (!) 156/70  Pulse: 75  Resp: 18  Temp: 98.6 F (37 C)  SpO2: 99%    Constitutional: Alert and oriented. Well appearing and in no distress. Eyes: Conjunctivae are normal. Normal extraocular movements. ENT      Head: Normocephalic and atraumatic.      Nose: No congestion/rhinnorhea.      Mouth/Throat: Mucous membranes are moist.      Neck: No stridor. Cardiovascular: Normal rate, regular rhythm. No murmurs, rubs, or gallops. Respiratory: Normal respiratory effort without tachypnea nor retractions. Breath sounds are clear and equal bilaterally. No wheezes/rales/rhonchi. Gastrointestinal: Soft and nontender. Normal bowel  sounds Musculoskeletal: Nontender with normal range of motion in extremities. No lower extremity tenderness nor edema. Neurologic:  Normal speech and language. No gross focal neurologic deficits are appreciated.  Skin:  Skin is warm, dry and intact. No rash noted. Psychiatric: Speech and behavior are normal.  ____________________________________________   LABS (pertinent positives/negatives)  Labs Reviewed  COMPREHENSIVE METABOLIC PANEL - Abnormal; Notable for the following components:      Result Value   Potassium 3.3 (*)    Glucose, Bld 114 (*)    All other components within normal limits  CBC - Abnormal; Notable for the following components:   Platelets 403 (*)    All other components within normal limits  URINALYSIS, COMPLETE (UACMP) WITH MICROSCOPIC - Abnormal; Notable for the following components:   Color, Urine YELLOW (*)    APPearance HAZY (*)    Bacteria, UA RARE (*)    All other components within normal limits  LIPASE, BLOOD    RADIOLOGY  Images were viewed by me of CT scan from 2 days ago IMPRESSION: 1. Partially obstructing calculus in the distal RIGHT ureter at the RIGHT vesicoureteral junction of 67mm 2. RIGHT nephrolithiasis. 3. No etiology for gastrointestinal bleeding identified. KUB IMPRESSION: Nonobstructive bowel gas pattern. Large amount of colonic stool present.  DIFFERENTIAL DIAGNOSIS  Renal colic, UTI, pyelonephritis, degenerative disc disease, muscle strain  ASSESSMENT AND PLAN  Abdominal pain, renal colic   Plan: The patient had presented for lower abdominal pain and back pain and was seen recently for same. Patient's labs did not reveal any acute process.  KUB did not identify any active kidney stones.  She did appear constipated and will be encouraged to take MiraLAX.  Otherwise she is cleared for outpatient follow-up.  Lenise Arena MD    Note: This note was generated in part or whole with voice recognition software. Voice  recognition is usually quite accurate but there are transcription errors that can and very often do occur. I apologize for any typographical errors that were not detected and corrected.     Earleen Newport, MD 03/28/20 2235

## 2020-03-28 NOTE — ED Notes (Signed)
See triage note, pt reports that she has had decreased urine output today with no urge to urinate. States she has urinated x2 since being in the ED for the past 4 hours.  C/o bilateral lower back pain and bilateral lower abdominal pain. Last BM this AM.  States has been having muscle cramps in BLE. States all sx started today.  Dr Mayford Knife at bedside

## 2020-03-28 NOTE — ED Notes (Signed)
Pt verbalizes understanding of d/c instructions, denies questions or concerns at this time

## 2020-03-31 ENCOUNTER — Other Ambulatory Visit: Payer: Self-pay

## 2020-03-31 ENCOUNTER — Emergency Department
Admission: EM | Admit: 2020-03-31 | Discharge: 2020-03-31 | Disposition: A | Discharge status: Home / Self Care | Payer: Self-pay | Attending: Emergency Medicine | Admitting: Emergency Medicine

## 2020-03-31 DIAGNOSIS — I10 Essential (primary) hypertension: Secondary | ICD-10-CM | POA: Insufficient documentation

## 2020-03-31 DIAGNOSIS — G894 Chronic pain syndrome: Secondary | ICD-10-CM | POA: Insufficient documentation

## 2020-03-31 DIAGNOSIS — R112 Nausea with vomiting, unspecified: Secondary | ICD-10-CM | POA: Insufficient documentation

## 2020-03-31 LAB — URINALYSIS, COMPLETE (UACMP) WITH MICROSCOPIC
Bilirubin Urine: NEGATIVE
Glucose, UA: NEGATIVE mg/dL
Hgb urine dipstick: NEGATIVE
Ketones, ur: NEGATIVE mg/dL
Leukocytes,Ua: NEGATIVE
Nitrite: NEGATIVE
Protein, ur: NEGATIVE mg/dL
Specific Gravity, Urine: 1.016 (ref 1.005–1.030)
pH: 7 (ref 5.0–8.0)

## 2020-03-31 LAB — COMPREHENSIVE METABOLIC PANEL
ALT: 20 U/L (ref 0–44)
AST: 24 U/L (ref 15–41)
Albumin: 3.9 g/dL (ref 3.5–5.0)
Alkaline Phosphatase: 71 U/L (ref 38–126)
Anion gap: 9 (ref 5–15)
BUN: 11 mg/dL (ref 8–23)
CO2: 30 mmol/L (ref 22–32)
Calcium: 9 mg/dL (ref 8.9–10.3)
Chloride: 103 mmol/L (ref 98–111)
Creatinine, Ser: 0.74 mg/dL (ref 0.44–1.00)
GFR calc Af Amer: >60 mL/min (ref >60)
GFR calc non Af Amer: >60 mL/min (ref >60)
Glucose, Bld: 102 mg/dL — ABNORMAL HIGH (ref 70–99)
Potassium: 3.5 mmol/L (ref 3.5–5.1)
Sodium: 142 mmol/L (ref 135–145)
Total Bilirubin: 0.6 mg/dL (ref 0.3–1.2)
Total Protein: 6.8 g/dL (ref 6.5–8.1)

## 2020-03-31 LAB — CBC
HCT: 37.6 % (ref 36.0–46.0)
Hemoglobin: 12.8 g/dL (ref 12.0–15.0)
MCH: 31.6 pg (ref 26.0–34.0)
MCHC: 34 g/dL (ref 30.0–36.0)
MCV: 92.8 fL (ref 80.0–100.0)
Platelets: 366 10*3/uL (ref 150–400)
RBC: 4.05 MIL/uL (ref 3.87–5.11)
RDW: 12.6 % (ref 11.5–15.5)
WBC: 7.8 10*3/uL (ref 4.0–10.5)
nRBC: 0 % (ref 0.0–0.2)

## 2020-03-31 LAB — LIPASE, BLOOD: Lipase: 25 U/L (ref 11–51)

## 2020-03-31 MED ORDER — SODIUM CHLORIDE 0.9% FLUSH: 3.0000 mL | Freq: Once | INTRAVENOUS | Status: DC

## 2020-03-31 MED ORDER — ETODOLAC 500 MG PO TABS
500.0000 mg | ORAL_TABLET | Freq: Two times a day (BID) | ORAL | 0 refills | Status: DC
Start: 2020-03-31 — End: 2020-10-25

## 2020-03-31 MED ORDER — PROMETHAZINE HCL 25 MG PO TABS: 25.0000 mg | ORAL_TABLET | Freq: Four times a day (QID) | ORAL | 0 refills | Status: DC | PRN

## 2020-03-31 NOTE — ED Triage Notes (Signed)
Pt c/o "hurting all over" with N/V.Pamela Hanson states she has been hurting since seen last Friday for kidney stone but "I think it is my pancreas".Pamela Hanson

## 2020-03-31 NOTE — ED Provider Notes (Signed)
  ER Provider Note       Time seen: 9:45 AM    I have reviewed the vital signs and the nursing notes.  HISTORY   Chief Complaint Emesis    HPI Pamela Hanson is a 64 y.o. female with a history of degenerative disc disease, hypertension, pancreatitis who presents today for hurting all over with nausea vomiting.  Patient states she is been hurting since last Friday for a kidney stone but she is concerned it may be a pancreas.  Discomfort is 8 out of 10.  Past Medical History:  Diagnosis Date  . Degenerative disc disease, lumbar   . Hypertension   . Pancreatitis     Past Surgical History:  Procedure Laterality Date  . CHOLECYSTECTOMY      Allergies Patient has no known allergies.   Review of Systems Constitutional: Negative for fever. Cardiovascular: Negative for chest pain. Respiratory: Negative for shortness of breath. Gastrointestinal: Positive for abdominal pain, nausea vomiting Musculoskeletal: Negative for back pain. Skin: Negative for rash. Neurological: Negative for headaches, focal weakness or numbness.  All systems negative/normal/unremarkable except as stated in the HPI  ____________________________________________   PHYSICAL EXAM:  VITAL SIGNS: Vitals:   03/31/20 0706 03/31/20 0927  BP: (!) 157/74 (!) 164/83  Pulse: 69 65  Resp: 16 20  Temp: 98.9 F (37.2 C) 98.9 F (37.2 C)  SpO2: 97% 98%    Constitutional: Alert and oriented. Well appearing and in no distress. Eyes: Conjunctivae are normal. Normal extraocular movements. Cardiovascular: Normal rate, regular rhythm. No murmurs, rubs, or gallops. Respiratory: Normal respiratory effort without tachypnea nor retractions. Breath sounds are clear and equal bilaterally. No wheezes/rales/rhonchi. Gastrointestinal: Nonfocal abdominal tenderness Musculoskeletal: Nontender with normal range of motion in extremities. No lower extremity tenderness nor edema. Neurologic:  Normal speech and language.  No gross focal neurologic deficits are appreciated.  Skin:  Skin is warm, dry and intact. No rash noted. Psychiatric: Speech and behavior are normal.  ____________________________________________   LABS (pertinent positives/negatives)  Labs Reviewed  COMPREHENSIVE METABOLIC PANEL - Abnormal; Notable for the following components:      Result Value   Glucose, Bld 102 (*)    All other components within normal limits  URINALYSIS, COMPLETE (UACMP) WITH MICROSCOPIC - Abnormal; Notable for the following components:   Color, Urine YELLOW (*)    APPearance CLOUDY (*)    Bacteria, UA RARE (*)    All other components within normal limits  LIPASE, BLOOD  CBC   DIFFERENTIAL DIAGNOSIS  Gastritis, gastroenteritis, dehydration, electrolyte vomit, kidney stone, pancreatitis, chronic pain  ASSESSMENT AND PLAN  Chronic pain   Plan: The patient had presented for pain all over with nausea and vomiting. Patient's labs were reassuring.  Unclear etiology for her symptoms with recent normal CT imaging, repeated lab work and repeated imaging.  She will be given antiemetics and is cleared for outpatient follow-up.  Daryel November MD    Note: This note was generated in part or whole with voice recognition software. Voice recognition is usually quite accurate but there are transcription errors that can and very often do occur. I apologize for any typographical errors that were not detected and corrected.     Emily Filbert, MD 03/31/20 [phone]

## 2020-03-31 NOTE — ED Notes (Signed)
Pt back from CT

## 2020-03-31 NOTE — ED Notes (Signed)
Pt reports pain all over her body. Pt states that she has been seen here several times for the same. Pt reports that she was told she had a kidney stone but it feels like something different. Pt reports she was given a toradol injection last time and her body feels like her body locked up and became stiff.

## 2020-03-31 NOTE — ED Notes (Signed)
Pt verbalizes understanding of d/c instructions, medications and follow up 

## 2020-08-29 ENCOUNTER — Other Ambulatory Visit: Payer: Self-pay | Admitting: Family Medicine

## 2020-09-13 ENCOUNTER — Other Ambulatory Visit: Payer: Self-pay

## 2020-09-13 ENCOUNTER — Ambulatory Visit: Payer: Medicaid Other | Admitting: Pharmacy Technician

## 2020-09-13 ENCOUNTER — Encounter (INDEPENDENT_AMBULATORY_CARE_PROVIDER_SITE_OTHER): Payer: Self-pay

## 2020-09-13 DIAGNOSIS — Z79899 Other long term (current) drug therapy: Secondary | ICD-10-CM

## 2020-09-13 NOTE — Progress Notes (Signed)
Completed [facility] application and contract.  Patient agreed to all terms of the [facility] contract.    Patient approved to receive medication assistance at [facility] until time for re-certification in 4401, and as long as eligibility criteria continues to be met.    Provided patient with community resource material based on her particular needs.    [facility]

## 2020-09-21 NOTE — Congregational Nurse Program (Signed)
Advised of HIPAA and client agrees to service today. Client  requesting BP screening as has HTN and is on meds. Last week started having pounding in head so increased Norsvac to 20 mg qd from the prescribed 10 mg. Does not have way to check BP. Also c/o that pancreatic pain is flared. Found that omeprazol 20mg /d helped in past (last prescribed 3/21) but is out of this med and can't afford OTC pills. Has access through [facility] to get other prescriptions. Last seen @ [facility] in early November. Is currently Homeless and has been at shelter 2 wks...unsure when she will be asked to leave since she has stayed 2 wks. Cannot find any housing. Has car...assists other residents with transportation for gas money to run errands. Co. Client re: risks of altering BP Rx w/o Dr. December. Co. To f/u with MD about symptoms and current change. BP today 136/82 without symptoms. Client to follow up w/ pharmacy to assess if any refills left on Omeprazol and to see if can be transferred to [facility] for filling or to follow up w/ Primary Care provider. Referred to [facility] to identify possible boarding room housing. This Nurse will look into finding source for BP cuff. Client to RTC in 1 wk for BP and follow up of status.

## 2020-10-25 ENCOUNTER — Emergency Department
Admission: EM | Admit: 2020-10-25 | Discharge: 2020-10-25 | Disposition: A | Discharge status: Home / Self Care | Payer: HRSA Program | Attending: Emergency Medicine | Admitting: Emergency Medicine

## 2020-10-25 ENCOUNTER — Other Ambulatory Visit: Payer: Self-pay

## 2020-10-25 ENCOUNTER — Encounter: Payer: Self-pay | Admitting: Intensive Care

## 2020-10-25 DIAGNOSIS — M791 Myalgia, unspecified site: Secondary | ICD-10-CM | POA: Diagnosis present

## 2020-10-25 DIAGNOSIS — Z79899 Other long term (current) drug therapy: Secondary | ICD-10-CM | POA: Insufficient documentation

## 2020-10-25 DIAGNOSIS — G8929 Other chronic pain: Secondary | ICD-10-CM

## 2020-10-25 DIAGNOSIS — Z87891 Personal history of nicotine dependence: Secondary | ICD-10-CM | POA: Diagnosis not present

## 2020-10-25 DIAGNOSIS — U071 COVID-19: Secondary | ICD-10-CM | POA: Insufficient documentation

## 2020-10-25 DIAGNOSIS — Z5900 Homelessness unspecified: Secondary | ICD-10-CM | POA: Diagnosis not present

## 2020-10-25 DIAGNOSIS — I10 Essential (primary) hypertension: Secondary | ICD-10-CM | POA: Insufficient documentation

## 2020-10-25 LAB — CBC WITH DIFFERENTIAL/PLATELET
Abs Immature Granulocytes: 0.03 10*3/uL (ref 0.00–0.07)
Basophils Absolute: 0 10*3/uL (ref 0.0–0.1)
Basophils Relative: 1 %
Eosinophils Absolute: 0 10*3/uL (ref 0.0–0.5)
Eosinophils Relative: 0 %
HCT: 38.6 % (ref 36.0–46.0)
Hemoglobin: 12.6 g/dL (ref 12.0–15.0)
Immature Granulocytes: 1 %
Lymphocytes Relative: 26 %
Lymphs Abs: 1.3 10*3/uL (ref 0.7–4.0)
MCH: 30.4 pg (ref 26.0–34.0)
MCHC: 32.6 g/dL (ref 30.0–36.0)
MCV: 93 fL (ref 80.0–100.0)
Monocytes Absolute: 0.8 10*3/uL (ref 0.1–1.0)
Monocytes Relative: 17 %
Neutro Abs: 2.7 10*3/uL (ref 1.7–7.7)
Neutrophils Relative %: 55 %
Platelets: 289 10*3/uL (ref 150–400)
RBC: 4.15 MIL/uL (ref 3.87–5.11)
RDW: 12.5 % (ref 11.5–15.5)
Smear Review: NORMAL
WBC: 4.7 10*3/uL (ref 4.0–10.5)
nRBC: 0 % (ref 0.0–0.2)

## 2020-10-25 LAB — COMPREHENSIVE METABOLIC PANEL
ALT: 49 U/L — ABNORMAL HIGH (ref 0–44)
AST: 51 U/L — ABNORMAL HIGH (ref 15–41)
Albumin: 3.9 g/dL (ref 3.5–5.0)
Alkaline Phosphatase: 98 U/L (ref 38–126)
Anion gap: 11 (ref 5–15)
BUN: 15 mg/dL (ref 8–23)
CO2: 26 mmol/L (ref 22–32)
Calcium: 9 mg/dL (ref 8.9–10.3)
Chloride: 101 mmol/L (ref 98–111)
Creatinine, Ser: 0.9 mg/dL (ref 0.44–1.00)
GFR, Estimated: >60 mL/min (ref >60)
Glucose, Bld: 101 mg/dL — ABNORMAL HIGH (ref 70–99)
Potassium: 3.8 mmol/L (ref 3.5–5.1)
Sodium: 138 mmol/L (ref 135–145)
Total Bilirubin: 0.6 mg/dL (ref 0.3–1.2)
Total Protein: 7.5 g/dL (ref 6.5–8.1)

## 2020-10-25 LAB — POC SARS CORONAVIRUS 2 AG -  ED: SARS Coronavirus 2 Ag: POSITIVE — AB

## 2020-10-25 LAB — LIPASE, BLOOD: Lipase: 27 U/L (ref 11–51)

## 2020-10-25 MED ORDER — METHYLPREDNISOLONE 4 MG PO TBPK: ORAL_TABLET | ORAL | 0 refills | Status: DC

## 2020-10-25 MED ORDER — BENZONATATE 100 MG PO CAPS: 100.0000 mg | ORAL_CAPSULE | Freq: Three times a day (TID) | ORAL | 0 refills | Status: AC | PRN

## 2020-10-25 MED ORDER — CYCLOBENZAPRINE HCL 10 MG PO TABS: 10.0000 mg | ORAL_TABLET | Freq: Three times a day (TID) | ORAL | 0 refills | Status: DC

## 2020-10-25 MED ORDER — AZITHROMYCIN 250 MG PO TABS: ORAL_TABLET | ORAL | 0 refills | Status: DC

## 2020-10-25 MED ORDER — ETODOLAC 500 MG PO TABS
500.0000 mg | ORAL_TABLET | Freq: Two times a day (BID) | ORAL | 0 refills | Status: DC | PRN
Start: 2020-10-25 — End: 2022-11-28

## 2020-10-25 NOTE — ED Notes (Signed)
Pt states that she lives in a homeless shelter, and started feeling feverish Sat. States that she has generalized body aches, fevers.

## 2020-10-25 NOTE — Discharge Instructions (Addendum)
[facility] or [facility].  Please call for an appointment. Take the Z-Pak and methylprednisone pack as directed Do not take your etodolac while taking the methylprednisolone pack.  You may take your other medications. Return to the emergency department if you are having difficulty breathing.

## 2020-10-25 NOTE — ED Triage Notes (Signed)
Patient c/o fever, body aches, green/white mucous with cough that started yesterday and no BM since Saturday

## 2020-10-25 NOTE — ED Provider Notes (Signed)
[facility] Emergency Department Provider Note  ____________________________________________   Event Date/Time   First MD Initiated Contact with Patient 10/25/20 1106     (approximate)  I have reviewed the triage vital signs and the nursing notes.   HISTORY  Chief Complaint Generalized Body Aches, Constipation, and Fever    HPI Pamela Hanson is a 64 y.o. female presents emergency department from the homeless shelter.  States that she did not feel well this morning but they threatened the police if she did not get out.   Patient has been achy all over.  States she is worried about her pancreas.  She did not get vaccinated for Covid or the flu.  States she thinks she got sick from a drug addict that they have dropped off the other night.  She denies chest pain or shortness of breath.  No vomiting.   Past Medical History:  Diagnosis Date  . Degenerative disc disease, lumbar   . Degenerative disc disease, lumbar   . Hypertension   . Pancreatitis     Patient Active Problem List   Diagnosis Date Noted  . History of hypokalemia 01/26/2020  . Gastroesophageal reflux disease without esophagitis 01/26/2020  . Pure hypercholesterolemia 06/01/2019  . IFG (impaired fasting glucose) 06/01/2019  . Myalgia 06/01/2019  . Hyperlipidemia 03/02/2019  . Essential hypertension 02/27/2019  . Class 1 obesity due to excess calories with serious comorbidity and body mass index (BMI) of 33.0 to 33.9 in adult 02/27/2019  . Chronic bilateral low back pain without sciatica 02/27/2019    Past Surgical History:  Procedure Laterality Date  . CHOLECYSTECTOMY      Prior to Admission medications   Medication Sig Start Date End Date Taking? Authorizing Provider  azithromycin (ZITHROMAX Z-PAK) 250 MG tablet 2 pills today then 1 pill a day for 4 days 10/25/20  Yes Dandria Griego, Roselyn Bering, PA-C  methylPREDNISolone (MEDROL DOSEPAK) 4 MG TBPK tablet Take 6 pills on day one then  decrease by 1 pill each day 10/25/20  Yes Kendra Woolford, Roselyn Bering, PA-C  amLODipine (NORVASC) 10 MG tablet Take 1 tablet (10 mg total) by mouth daily. 01/07/20   Doren Custard, FNP  cyclobenzaprine (FLEXERIL) 10 MG tablet Take 1 tablet (10 mg total) by mouth 3 (three) times daily. 10/25/20   Shea Kapur, Roselyn Bering, PA-C  etodolac (LODINE) 500 MG tablet Take 1 tablet (500 mg total) by mouth 2 (two) times daily as needed (for pain). 10/25/20   Rhyse Loux, Roselyn Bering, PA-C  omeprazole (PRILOSEC) 20 MG capsule Take 1 capsule (20 mg total) by mouth 2 (two) times daily before a meal. 01/07/20   Doren Custard, FNP  polyethylene glycol (MIRALAX / GLYCOLAX) 17 g packet Take 17 g by mouth daily. 03/28/20   Emily Filbert, MD  potassium chloride (KLOR-CON) 10 MEQ tablet Take 1 tablet (10 mEq total) by mouth daily. 01/07/20   Doren Custard, FNP  promethazine (PHENERGAN) 25 MG tablet Take 1 tablet (25 mg total) by mouth every 6 (six) hours as needed for nausea or vomiting. 03/31/20 10/25/20  Emily Filbert, MD    Allergies Morphine and related  History reviewed. No pertinent family history.  Social History Social History   Tobacco Use  . Smoking status: Former Smoker    Types: Cigarettes  . Smokeless tobacco: Never Used  Vaping Use  . Vaping Use: Never used  Substance Use Topics  . Alcohol use: Not Currently    Comment: occ  .  Drug use: Not Currently    Types: Marijuana    Review of Systems  Constitutional: Positive fever/chills Eyes: No visual changes. ENT: No sore throat. Respiratory: Denies cough Cardiovascular: Denies chest pain Gastrointestinal: Positive abdominal pain Genitourinary: Negative for dysuria. Musculoskeletal: Negative for back pain. Skin: Negative for rash. Psychiatric: no mood changes,     ____________________________________________   PHYSICAL EXAM:  VITAL SIGNS: ED Triage Vitals  Enc Vitals Group     BP 10/25/20 1047 (!) 156/94     Pulse Rate 10/25/20 1047 85      Resp 10/25/20 1047 16     Temp 10/25/20 1047 99.3 F (37.4 C)     Temp Source 10/25/20 1047 Oral     SpO2 10/25/20 1047 99 %     Weight 10/25/20 1048 155 lb (70.3 kg)     Height 10/25/20 1048 4' 11.5" (1.511 m)     Head Circumference --      Peak Flow --      Pain Score 10/25/20 1047 7     Pain Loc --      Pain Edu? --      Excl. in GC? --     Constitutional: Alert and oriented. Well appearing and in no acute distress. Eyes: Conjunctivae are normal.  Head: Atraumatic. Nose: No congestion/rhinnorhea. Mouth/Throat: Mucous membranes are moist.   Neck:  supple no lymphadenopathy noted Cardiovascular: Normal rate, regular rhythm. Heart sounds are normal Respiratory: Normal respiratory effort.  No retractions, lungs c t a  Abd: soft tender in all 4, bs normal all 4 quad GU: deferred Musculoskeletal: FROM all extremities, warm and well perfused Neurologic:  Normal speech and language.  Skin:  Skin is warm, dry and intact. No rash noted. Psychiatric: Mood and affect are normal. Speech and behavior are normal.  ____________________________________________   LABS (all labs ordered are listed, but only abnormal results are displayed)  Labs Reviewed  COMPREHENSIVE METABOLIC PANEL - Abnormal; Notable for the following components:      Result Value   Glucose, Bld 101 (*)    AST 51 (*)    ALT 49 (*)    All other components within normal limits  POC SARS CORONAVIRUS 2 AG -  ED - Abnormal; Notable for the following components:   SARS Coronavirus 2 Ag Positive (*)    All other components within normal limits  CBC WITH DIFFERENTIAL/PLATELET  LIPASE, BLOOD   ____________________________________________   ____________________________________________  RADIOLOGY  kub  ____________________________________________   PROCEDURES  Procedure(s) performed: No  Procedures    ____________________________________________   INITIAL IMPRESSION / ASSESSMENT AND PLAN / ED  COURSE  Pertinent labs & imaging results that were available during my care of the patient were reviewed by me and considered in my medical decision making (see chart for details).    Patient is a 64 year old female lives at homeless shelters complained about her abdominal pain, fever, chills and body aches.  Patient is not vaccinated for Covid or flu.  See HPI.  Physical exam shows patient to have tender abdomen, remainder of exam is unremarkable  DDx: Pancreatitis, constipation, Covid, influenza  Covid antigen test is positive   Patient's labs are reassuring.  CBC, metabolic panel and lipase are all normal.  I feel that patient is trying to have [place] find a reason to put her in the hospital since she has been kicked out of the homeless shelter.  Explained to her that since her vitals are stable we will not be admitting  her.  She was given a prescription for a Z-Pak and Medrol Dosepak.  She can return if difficulty breathing.  [facility] or [facility].  We did discuss her medications and which ones not to take while taking these 2 medications.  She was discharged in stable condition.  Pamela Hanson was evaluated in Emergency Department on 10/25/2020 for the symptoms described in the history of present illness. She was evaluated in the context of the global COVID-19 pandemic, which necessitated consideration that the patient might be at risk for infection with the SARS-CoV-2 virus that causes COVID-19. Institutional protocols and algorithms that pertain to the evaluation of patients at risk for COVID-19 are in a state of rapid change based on information released by regulatory bodies including the CDC and federal and state organizations. These policies and algorithms were followed during the patient's care in the ED.    As part of my medical decision making, I reviewed the following data within the electronic MEDICAL RECORD NUMBER Nursing  notes reviewed and incorporated, Labs reviewed , Old chart reviewed, Notes from prior ED visits and [place] Controlled Substance Database  ____________________________________________   FINAL CLINICAL IMPRESSION(S) / ED DIAGNOSES  Final diagnoses:  COVID-19      NEW MEDICATIONS STARTED DURING THIS VISIT:  New Prescriptions   AZITHROMYCIN (ZITHROMAX Z-PAK) 250 MG TABLET    2 pills today then 1 pill a day for 4 days   METHYLPREDNISOLONE (MEDROL DOSEPAK) 4 MG TBPK TABLET    Take 6 pills on day one then decrease by 1 pill each day     Note:  This document was prepared using Dragon voice recognition software and may include unintentional dictation errors.    Faythe Ghee, PA-C 10/25/20 1246    Jene Every, MD 10/25/20 [phone]

## 2020-10-25 NOTE — ED Notes (Signed)
Patient verbalizes understanding of discharge instructions. Opportunity for questioning and answers were provided. Armband removed by staff, pt discharged from ED. Ambulated out to lobby  

## 2020-11-04 ENCOUNTER — Emergency Department: Payer: HRSA Program

## 2020-11-04 ENCOUNTER — Emergency Department
Admission: EM | Admit: 2020-11-04 | Discharge: 2020-11-04 | Disposition: A | Discharge status: Home / Self Care | Payer: HRSA Program | Attending: Emergency Medicine | Admitting: Emergency Medicine

## 2020-11-04 ENCOUNTER — Other Ambulatory Visit: Payer: Self-pay

## 2020-11-04 DIAGNOSIS — I10 Essential (primary) hypertension: Secondary | ICD-10-CM | POA: Diagnosis not present

## 2020-11-04 DIAGNOSIS — Z87891 Personal history of nicotine dependence: Secondary | ICD-10-CM | POA: Insufficient documentation

## 2020-11-04 DIAGNOSIS — J40 Bronchitis, not specified as acute or chronic: Secondary | ICD-10-CM | POA: Diagnosis not present

## 2020-11-04 DIAGNOSIS — Z79899 Other long term (current) drug therapy: Secondary | ICD-10-CM | POA: Diagnosis not present

## 2020-11-04 DIAGNOSIS — U071 COVID-19: Secondary | ICD-10-CM | POA: Diagnosis not present

## 2020-11-04 DIAGNOSIS — R059 Cough, unspecified: Secondary | ICD-10-CM | POA: Diagnosis present

## 2020-11-04 MED ORDER — ALBUTEROL SULFATE HFA 108 (90 BASE) MCG/ACT IN AERS: 2.0000 | INHALATION_SPRAY | Freq: Four times a day (QID) | RESPIRATORY_TRACT | 0 refills | Status: DC | PRN

## 2020-11-04 NOTE — ED Provider Notes (Signed)
[facility] Emergency Department Provider Note ____________________________________________  Time seen: 1337  I have reviewed the triage vital signs and the nursing notes.  HISTORY  Chief Complaint  Fatigue and Cough  HPI Pamela Hanson is a 65 y.o. female presents herself to the ED for evaluation of ongoing symptoms related to recent COVID diagnosis.  Patient was diagnosed about 10 days prior, has been placed in a local motel by the health department as she is currently on home.  Patient had reason of her 10-day quarantine, and was expected to be discharged today.  She noted some continued or worsening shortness of breath that she got up overnight desatted.  She presents now for evaluation of her symptoms patient has any fever, chills, sweats, chest pain, or syncope.   Past Medical History:  Diagnosis Date  . Degenerative disc disease, lumbar   . Degenerative disc disease, lumbar   . Hypertension   . Pancreatitis     Patient Active Problem List   Diagnosis Date Noted  . History of hypokalemia 01/26/2020  . Gastroesophageal reflux disease without esophagitis 01/26/2020  . Pure hypercholesterolemia 06/01/2019  . IFG (impaired fasting glucose) 06/01/2019  . Myalgia 06/01/2019  . Hyperlipidemia 03/02/2019  . Essential hypertension 02/27/2019  . Class 1 obesity due to excess calories with serious comorbidity and body mass index (BMI) of 33.0 to 33.9 in adult 02/27/2019  . Chronic bilateral low back pain without sciatica 02/27/2019    Past Surgical History:  Procedure Laterality Date  . CHOLECYSTECTOMY      Prior to Admission medications   Medication Sig Start Date End Date Taking? Authorizing Provider  albuterol (VENTOLIN HFA) 108 (90 Base) MCG/ACT inhaler Inhale 2 puffs into the lungs every 6 (six) hours as needed for shortness of breath. 11/04/20  Yes Jalaysha Skilton, Charlesetta Ivory, PA-C  amLODipine (NORVASC) 10 MG tablet Take 1 tablet (10 mg total) by mouth  daily. 01/07/20   Doren Custard, FNP  azithromycin (ZITHROMAX Z-PAK) 250 MG tablet 2 pills today then 1 pill a day for 4 days 10/25/20   Sherrie Mustache Roselyn Bering, PA-C  benzonatate (TESSALON PERLES) 100 MG capsule Take 1 capsule (100 mg total) by mouth 3 (three) times daily as needed for cough. 10/25/20 10/25/21  Fisher, Roselyn Bering, PA-C  cyclobenzaprine (FLEXERIL) 10 MG tablet Take 1 tablet (10 mg total) by mouth 3 (three) times daily. 10/25/20   Fisher, Roselyn Bering, PA-C  etodolac (LODINE) 500 MG tablet Take 1 tablet (500 mg total) by mouth 2 (two) times daily as needed (for pain). 10/25/20   Fisher, Roselyn Bering, PA-C  methylPREDNISolone (MEDROL DOSEPAK) 4 MG TBPK tablet Take 6 pills on day one then decrease by 1 pill each day 10/25/20   Faythe Ghee, PA-C  omeprazole (PRILOSEC) 20 MG capsule Take 1 capsule (20 mg total) by mouth 2 (two) times daily before a meal. 01/07/20   Doren Custard, FNP  polyethylene glycol (MIRALAX / GLYCOLAX) 17 g packet Take 17 g by mouth daily. 03/28/20   Emily Filbert, MD  potassium chloride (KLOR-CON) 10 MEQ tablet Take 1 tablet (10 mEq total) by mouth daily. 01/07/20   Doren Custard, FNP  promethazine (PHENERGAN) 25 MG tablet Take 1 tablet (25 mg total) by mouth every 6 (six) hours as needed for nausea or vomiting. 03/31/20 10/25/20  Emily Filbert, MD    Allergies Morphine and related  No family history on file.  Social History Social History   Tobacco  Use  . Smoking status: Former Smoker    Types: Cigarettes  . Smokeless tobacco: Never Used  Vaping Use  . Vaping Use: Never used  Substance Use Topics  . Alcohol use: Not Currently    Comment: occ  . Drug use: Not Currently    Types: Marijuana    Review of Systems  Constitutional: Negative for fever. Eyes: Negative for visual changes. ENT: Negative for sore throat. Cardiovascular: Negative for chest pain. Respiratory: Positive for shortness of breath. Gastrointestinal: Negative for abdominal pain,  vomiting and diarrhea. Genitourinary: Negative for dysuria. Musculoskeletal: Negative for back pain. Skin: Negative for rash. Neurological: Negative for headaches, focal weakness or numbness. ____________________________________________  PHYSICAL EXAM:  VITAL SIGNS: ED Triage Vitals  Enc Vitals Group     BP 11/04/20 1157 124/75     Pulse Rate 11/04/20 1157 87     Resp 11/04/20 1157 18     Temp 11/04/20 1157 (!) 97.5 F (36.4 C)     Temp src --      SpO2 11/04/20 1157 95 %     Weight 11/04/20 1219 155 lb (70.3 kg)     Height 11/04/20 1219 4\' 11"  (1.499 m)     Head Circumference --      Peak Flow --      Pain Score 11/04/20 1218 0     Pain Loc --      Pain Edu? --      Excl. in [place]? --     Constitutional: Alert and oriented. Well appearing and in no distress. Head: Normocephalic and atraumatic. Eyes: Conjunctivae are normal. PERRL. Normal extraocular movements Ears: Canals clear. TMs intact bilaterally. Nose: No congestion/rhinorrhea/epistaxis. Mouth/Throat: Mucous membranes are moist. Neck: Supple. No thyromegaly. Hematological/Lymphatic/Immunological: No cervical lymphadenopathy. Cardiovascular: Normal rate, regular rhythm. Normal distal pulses. Respiratory: Normal respiratory effort. No wheezes/rales/rhonchi. Gastrointestinal: Soft and nontender. No distention. Musculoskeletal: Nontender with normal range of motion in all extremities.  Neurologic:  Normal gait without ataxia. Normal speech and language. No gross focal neurologic deficits are appreciated. Skin:  Skin is warm, dry and intact. No rash noted. Psychiatric: Mood and affect are normal. Patient exhibits appropriate insight and judgment. ____________________________________________   LABS (pertinent positives/negatives) Labs Reviewed - No data to display ____________________________________________   RADIOLOGY  CXR  IMPRESSION: Interval cardiomegaly and mild bilateral linear atelectasis  or scarring. ____________________________________________  PROCEDURES  Procedures ____________________________________________  INITIAL IMPRESSION / ASSESSMENT AND PLAN / ED COURSE  DDX: CAP, Covid pneumonia, bronchitis  Patient ED evaluation of symptoms related to recent COVID diagnosis but she reports some continued shortness of breath and fatigue.  Sams arrived on time.  Patient's x-rays also negative for any acute intrathoracic process.  Patient had not started the steroid Dosepak that was previously prescribed to her for symptomatic treatment.  She will be discharged with a prescription for albuterol, she will continue with the previously prescribed Tessalon Perles, and will start the steroid as directed.  She is encouraged to follow-up with a local primary provider for ongoing symptoms peer return precautions have been discussed.   YERALDI FIDLER was evaluated in Emergency Department on 11/04/2020 for the symptoms described in the history of present illness. She was evaluated in the context of the global COVID-19 pandemic, which necessitated consideration that the patient might be at risk for infection with the SARS-CoV-2 virus that causes COVID-19. Institutional protocols and algorithms that pertain to the evaluation of patients at risk for COVID-19 are in a state of rapid change based  on information released by regulatory bodies including the CDC and federal and state organizations. These policies and algorithms were followed during the patient's care in the ED. ____________________________________________  FINAL CLINICAL IMPRESSION(S) / ED DIAGNOSES  Final diagnoses:  Bronchitis due to COVID-19 virus      Lissa Hoard, PA-C 11/04/20 1907    Sharman Cheek, MD 11/05/20 1510

## 2020-11-04 NOTE — ED Triage Notes (Signed)
Pt to ED via POV from hotel, pt states that she was diagnosed with COVID on 12/28. Pt states that the health department put her in a hotel to quarantine, pt states that she was to finish her quarantine today but states that when she got up in the middle of the night to use the bathroom she felt very fatigue. Pt states that she also still have a cough and also dry mouth. Pt is in NAD.

## 2020-11-04 NOTE — Discharge Instructions (Signed)
You should take the prescription meds as directed. Follow-up with your provider for continued symptoms.

## 2020-11-15 ENCOUNTER — Other Ambulatory Visit: Payer: Self-pay | Admitting: Physician Assistant

## 2020-11-17 ENCOUNTER — Ambulatory Visit: Payer: Medicaid Other | Admitting: Pharmacist

## 2020-11-17 ENCOUNTER — Other Ambulatory Visit: Payer: Self-pay

## 2020-11-17 ENCOUNTER — Encounter: Payer: Self-pay | Admitting: Pharmacist

## 2020-11-17 VITALS — BP 120/70 | Ht 60.0 in | Wt 155.0 lb

## 2020-11-17 DIAGNOSIS — Z79899 Other long term (current) drug therapy: Secondary | ICD-10-CM

## 2020-11-17 NOTE — Progress Notes (Signed)
Medication Management Clinic Visit Note  Patient: Pamela Hanson MRN: [id number] Date of Birth: 11-13-1955 [facility]: [facility]    MIRZA KIDNEY 65 y.o. female presents for a medication management visit today. Verified pt with two identifiers.   BP: 120/70 mmHg  Patient Information   Past Medical History:  Diagnosis Date  . Degenerative disc disease, lumbar   . Degenerative disc disease, lumbar   . Hypertension   . Pancreatitis       Past Surgical History:  Procedure Laterality Date  . CHOLECYSTECTOMY       Family History  Problem Relation Age of Onset  . Vision loss Mother   . Emphysema Father   . Cancer Maternal Grandmother   . Heart disease Maternal Grandfather     New Diagnoses (since last visit): Bronchitis (11/04/2020)  Family Support: Good - has a best friend she reaches out to  Lifestyle Diet: Breakfast: eggs, cheese, yogurt Lunch: chicken caesar salad Dinner: chicken caesar salad Drinks: water, soda     Current Exercise Habits: The patient does not participate in regular exercise at present  Exercise limited by: orthopedic condition(s)    Social History   Substance and Sexual Activity  Alcohol Use Not Currently   Comment: occ      Social History   Tobacco Use  Smoking Status Former Smoker  . Types: Cigarettes  Smokeless Tobacco Never Used  Tobacco Comment   quit 30 years ago      Health Maintenance  Topic Date Due  . Hepatitis C Screening  Never done  . COVID-19 Vaccine (1) Never done  . HIV Screening  Never done  . TETANUS/TDAP  Never done  . PAP SMEAR-Modifier  Never done  . INFLUENZA VACCINE  Never done  . MAMMOGRAM  01/25/2021 (Originally 06/23/2008)  . COLONOSCOPY (Pts 45-17yrs Insurance coverage will need to be confirmed)  01/25/2021 (Originally 06/24/2003)   Health Maintenance/Date Completed  Last ED visit: 11/04/2020 Last Visit to PCP: 11/15/2020 Next Visit to PCP: no follow up scheduled  Dental  Exam: unknown Eye Exam: 2016 Pelvic/PAP Exam: unknown Mammogram: planned for 01/25/2021 Colonoscopy: planned for 01/25/2021 Flu Vaccine: due Pneumonia Vaccine: due COVID-19 Vaccine: recently had COVID 10/25/2020 - recovered Shingrix Vaccine: due  Outpatient Encounter Medications as of 11/17/2020  Medication Sig  . albuterol (VENTOLIN HFA) 108 (90 Base) MCG/ACT inhaler Inhale 2 puffs into the lungs every 6 (six) hours as needed for shortness of breath.  Pamela Hanson amLODipine (NORVASC) 10 MG tablet Take 1 tablet (10 mg total) by mouth daily. (Patient taking differently: Take 20 mg by mouth daily.)  . benzonatate (TESSALON PERLES) 100 MG capsule Take 1 capsule (100 mg total) by mouth 3 (three) times daily as needed for cough.  . cyclobenzaprine (FLEXERIL) 10 MG tablet Take 1 tablet (10 mg total) by mouth 3 (three) times daily. (Patient taking differently: Take 10 mg by mouth 2 (two) times daily as needed.)  . etodolac (LODINE) 500 MG tablet Take 1 tablet (500 mg total) by mouth 2 (two) times daily as needed (for pain).  Pamela Hanson omeprazole (PRILOSEC) 20 MG capsule Take 1 capsule (20 mg total) by mouth 2 (two) times daily before a meal.  . potassium chloride (KLOR-CON) 10 MEQ tablet Take 1 tablet (10 mEq total) by mouth daily.  . methylPREDNISolone (MEDROL DOSEPAK) 4 MG TBPK tablet Take 6 pills on day one then decrease by 1 pill each day (Patient not taking: Reported on 11/17/2020)  . [DISCONTINUED] azithromycin (ZITHROMAX Z-PAK)  250 MG tablet 2 pills today then 1 pill a day for 4 days  . [DISCONTINUED] polyethylene glycol (MIRALAX / GLYCOLAX) 17 g packet Take 17 g by mouth daily.  . [DISCONTINUED] promethazine (PHENERGAN) 25 MG tablet Take 1 tablet (25 mg total) by mouth every 6 (six) hours as needed for nausea or vomiting.   No facility-administered encounter medications on file as of 11/17/2020.     Assessment and Plan: HTN Patient currently takes amlodipine for her BP and states that she has been self  titrating her medication and is now taking 20mg  per day. Ensured that pt was not running out of medication since she is taking more than what is prescribed. Pt confirmed she does not need a refill at this time. Informed her to let the doctor know at her next appt so they can increase the dose for her on the next prescription. Her BP today was controlled with a reading of 120/86mmHg. Pt has changed her diet and is able to do light exercises. BP is currently controlled; continue current regimen.    Degenerative disc disease (L5)/chronic back pain w/o sciatica Pt takes etodolac and cyclobenzaprine and states that her pain is well controlled. She states she has cut down from TID to BID with her cyclobenzaprine and will go down to QD PRN if tolerated. She is able to do some light stretching and body weight squats that help with her pain. She is unable to do routine exercise due to her current living situation (living in her car at the moment) and limitations from her back pain. Encourage pt to continue her light exercises and reach out to her PCP if her pain worsens or becomes more frequent.   Recent bronchitis Pt was recently in the ED for bronchitis 2/2 COVID. She completed a Zpak and still has tessalon pearls and albuterol. Pt chose not to take medrol because she read she would have to stop taking her etodolac and she did not want to stop that medication as it has helped significantly with her pain.   Hx of hypokalemia Pt has a history of hypokalemia and is on chronic KCl 74mEq/day. Her last K was WNL at 3.8 on 10/25/2020. Continue current regimen.  GERD Pt currently takes omeprazole BID and did not have any complaints at this time. Continue current regimen.   Access/Adherence Pt came in to pick up her medications today. She reports using a pill box to remain adherent to her medications. No refills needed at this time.    10/27/2020, PharmD Pharmacy Resident  11/17/2020 3:22 PM

## 2020-11-25 IMAGING — DX DG ABDOMEN 1V
2 series · 2 of 2 positions shown · non-contrast
Comparison: None.

CLINICAL DATA: Abdominal pain

EXAM:
ABDOMEN - 1 VIEW

[abdomen supine (1 of 2)]
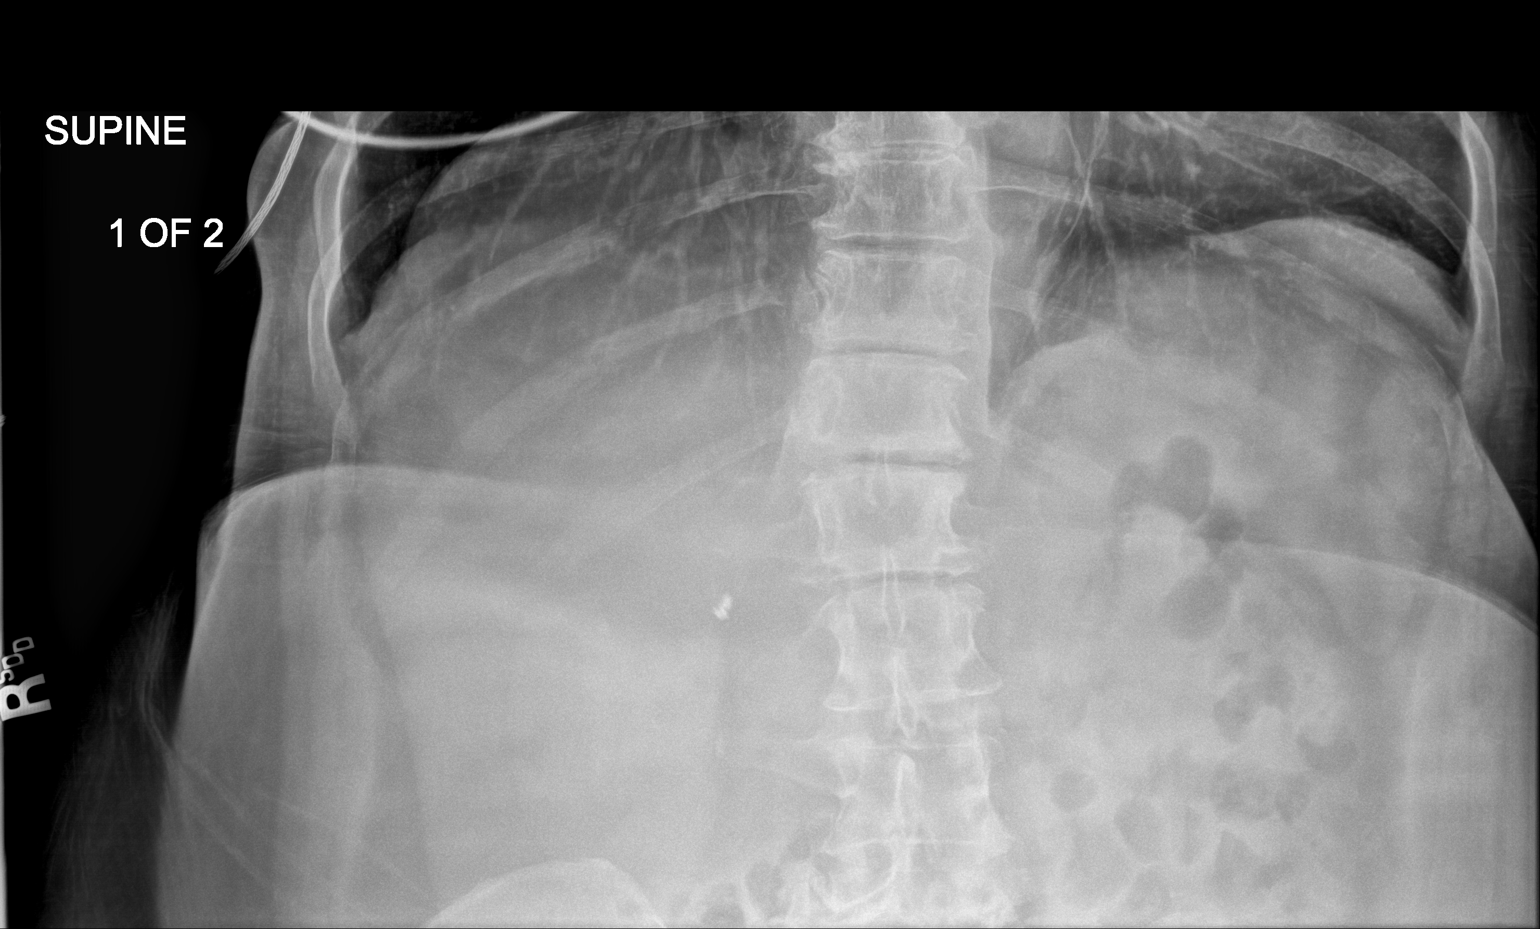

[abdomen supine (2 of 2)]
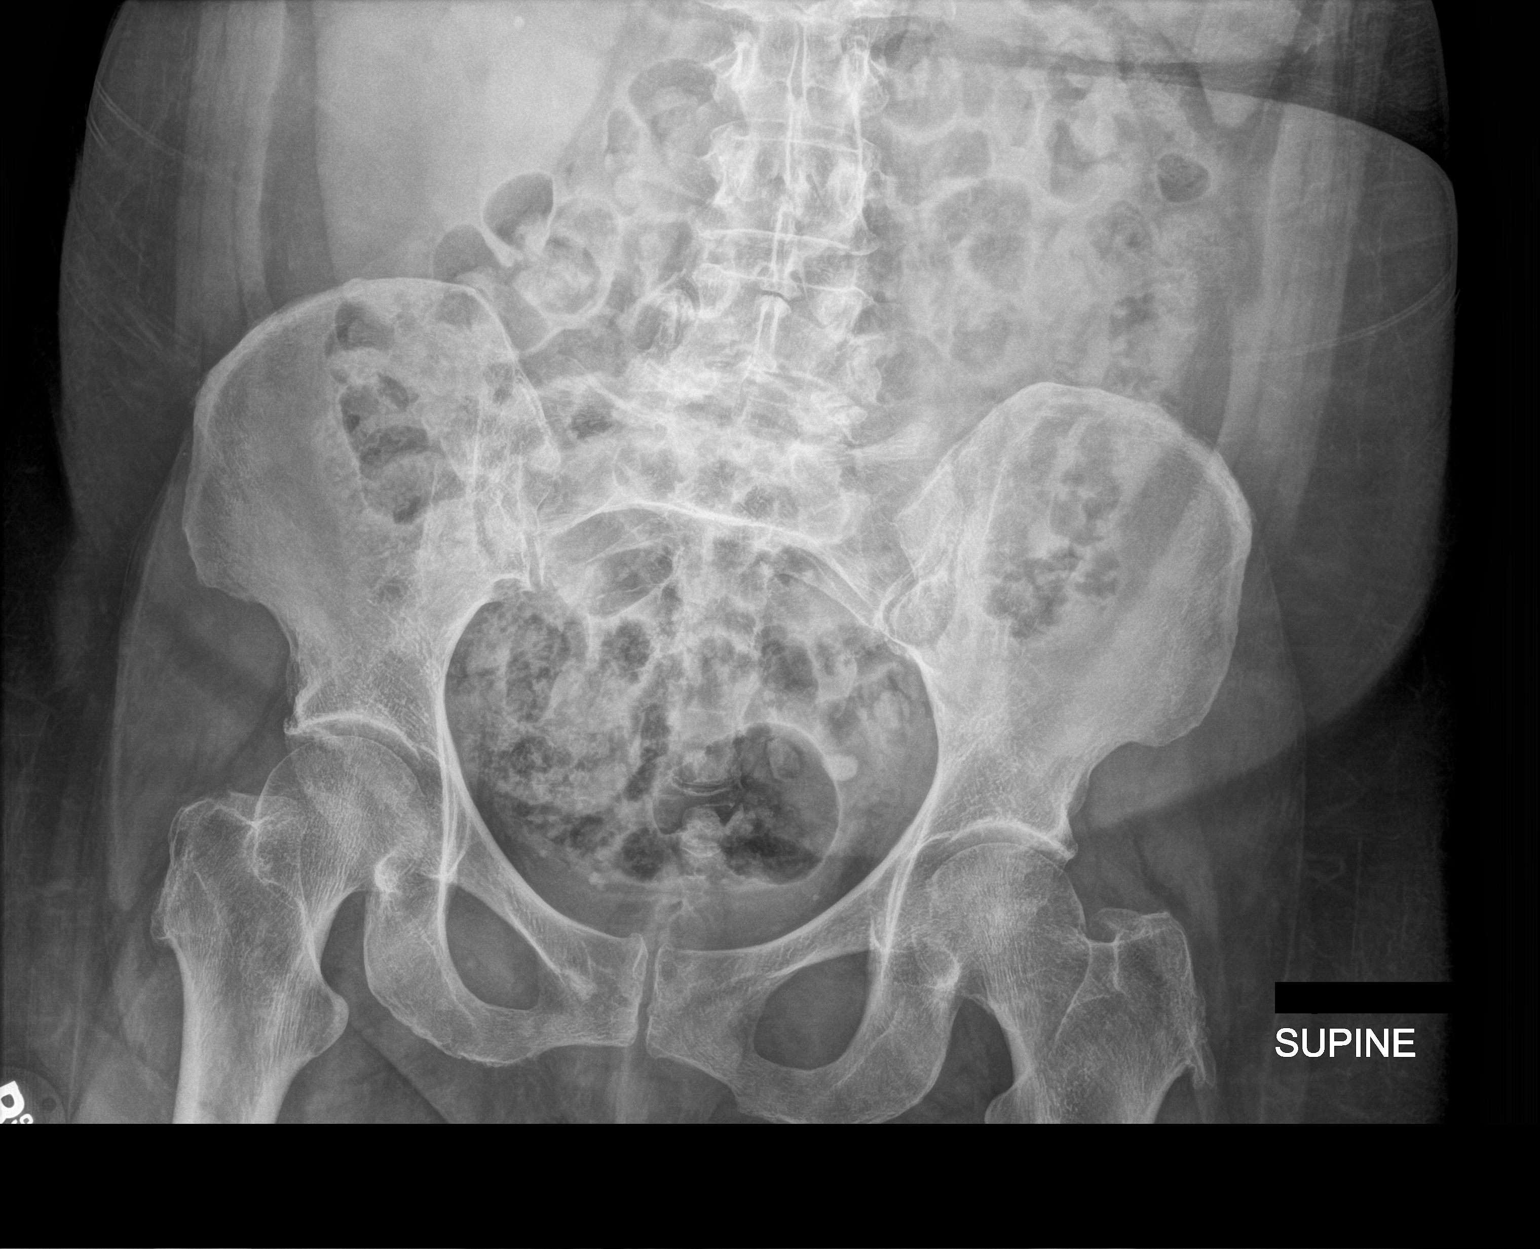

[2 of 2 positions shown; findings below may reference images not displayed]

FINDINGS: The bowel gas pattern is normal. Moderate to large amount of colonic
stool is present. No radio-opaque calculi or other significant
radiographic abnormality are seen.
IMPRESSION: Nonobstructive bowel gas pattern. Large amount of colonic stool
present.

## 2021-01-02 ENCOUNTER — Other Ambulatory Visit: Payer: Self-pay | Admitting: Family Medicine

## 2021-01-04 ENCOUNTER — Telehealth: Payer: Self-pay | Admitting: Pharmacist

## 2021-01-04 NOTE — Telephone Encounter (Signed)
Patient failed to provide requested 2022 financial documentation. Unable to determine patient's eligibility status. No additional medication assistance will be provided by [facility] without the required proof of income documentation. Patient notified by letter.  [facility] Administrative Assistant 

## 2021-01-11 ENCOUNTER — Telehealth: Payer: Self-pay | Admitting: Pharmacist

## 2021-01-11 NOTE — Telephone Encounter (Signed)
Provided 2022 proof of income. Approved to receive medication assistance at [facility] until time for re-certification in 5364, and as long as eligibility criteria continues to be met.   [place]

## 2021-01-31 ENCOUNTER — Other Ambulatory Visit: Payer: Self-pay

## 2021-01-31 MED FILL — Etodolac Tab 500 MG: ORAL | 20 days supply | Qty: 40 | Fill #0 | Status: AC

## 2021-01-31 MED FILL — Omeprazole Cap Delayed Release 20 MG: ORAL | 60 days supply | Qty: 60 | Fill #0 | Status: AC

## 2021-01-31 MED FILL — Cyclobenzaprine HCl Tab 10 MG: ORAL | 30 days supply | Qty: 90 | Fill #0 | Status: AC

## 2021-01-31 MED FILL — Hydroxyzine Pamoate Cap 25 MG: ORAL | 30 days supply | Qty: 120 | Fill #0 | Status: AC

## 2021-02-01 ENCOUNTER — Other Ambulatory Visit: Payer: Self-pay

## 2021-02-20 ENCOUNTER — Other Ambulatory Visit: Payer: Self-pay

## 2021-02-23 ENCOUNTER — Other Ambulatory Visit: Payer: Self-pay

## 2021-02-23 MED FILL — Etodolac Tab 500 MG: ORAL | 15 days supply | Qty: 30 | Fill #1 | Status: AC

## 2021-03-02 ENCOUNTER — Other Ambulatory Visit: Payer: Self-pay

## 2021-03-02 MED FILL — Potassium Chloride Tab ER 10 mEq: ORAL | 30 days supply | Qty: 30 | Fill #0 | Status: AC

## 2021-03-02 MED FILL — Hydroxyzine Pamoate Cap 25 MG: ORAL | 30 days supply | Qty: 120 | Fill #1 | Status: AC

## 2021-03-14 ENCOUNTER — Other Ambulatory Visit: Payer: Self-pay

## 2021-03-15 ENCOUNTER — Other Ambulatory Visit: Payer: Self-pay

## 2021-03-15 MED FILL — Amlodipine Besylate Tab 10 MG (Base Equivalent): ORAL | 90 days supply | Qty: 90 | Fill #0 | Status: AC

## 2021-03-16 ENCOUNTER — Other Ambulatory Visit: Payer: Self-pay

## 2021-03-17 ENCOUNTER — Other Ambulatory Visit: Payer: Self-pay

## 2021-03-21 ENCOUNTER — Other Ambulatory Visit: Payer: Self-pay

## 2021-03-22 ENCOUNTER — Other Ambulatory Visit: Payer: Self-pay

## 2021-03-22 MED ORDER — POTASSIUM CHLORIDE ER 10 MEQ PO TBCR
10.0000 meq | EXTENDED_RELEASE_TABLET | Freq: Every day | ORAL | 1 refills | Status: DC
  Filled 2021-06-15: qty 90, 90d supply, fill #0
  Filled 2021-11-10: qty 30, 30d supply, fill #0

## 2021-03-22 MED ORDER — ETODOLAC 500 MG PO TABS
500.0000 mg | ORAL_TABLET | Freq: Two times a day (BID) | ORAL | 1 refills | Status: DC
  Filled 2021-03-22: qty 60, 30d supply, fill #0
  Filled 2021-04-21: qty 60, 30d supply, fill #1
  Filled 2021-05-22: qty 40, 20d supply, fill #2
  Filled 2021-06-15: qty 20, 10d supply, fill #3

## 2021-03-22 MED ORDER — CYCLOBENZAPRINE HCL 10 MG PO TABS
ORAL_TABLET | ORAL | 1 refills | Status: DC
  Filled 2021-03-22: qty 60, 30d supply, fill #0
  Filled 2021-04-21: qty 60, 30d supply, fill #1
  Filled 2021-05-22: qty 60, 30d supply, fill #2
  Filled 2021-06-16: qty 6, 3d supply, fill #3

## 2021-03-30 ENCOUNTER — Other Ambulatory Visit: Payer: Self-pay

## 2021-03-30 MED FILL — Hydroxyzine Pamoate Cap 25 MG: ORAL | 30 days supply | Qty: 120 | Fill #2 | Status: AC

## 2021-04-02 MED ORDER — OMEPRAZOLE 20 MG PO CPDR
DELAYED_RELEASE_CAPSULE | Freq: Every day | ORAL | 0 refills | Status: DC
  Filled 2021-04-02: qty 90, 90d supply, fill #0

## 2021-04-03 ENCOUNTER — Other Ambulatory Visit: Payer: Self-pay

## 2021-04-21 ENCOUNTER — Other Ambulatory Visit: Payer: Self-pay

## 2021-04-27 ENCOUNTER — Other Ambulatory Visit: Payer: Self-pay

## 2021-04-27 MED FILL — Hydroxyzine Pamoate Cap 25 MG: ORAL | 30 days supply | Qty: 120 | Fill #3 | Status: AC

## 2021-05-16 ENCOUNTER — Other Ambulatory Visit: Payer: Self-pay

## 2021-05-22 ENCOUNTER — Other Ambulatory Visit: Payer: Self-pay

## 2021-05-22 MED FILL — Hydroxyzine Pamoate Cap 25 MG: ORAL | 30 days supply | Qty: 120 | Fill #4 | Status: AC

## 2021-06-12 ENCOUNTER — Other Ambulatory Visit: Payer: Self-pay

## 2021-06-15 ENCOUNTER — Other Ambulatory Visit: Payer: Self-pay

## 2021-06-15 MED FILL — Amlodipine Besylate Tab 10 MG (Base Equivalent): ORAL | 90 days supply | Qty: 90 | Fill #1 | Status: AC

## 2021-06-16 ENCOUNTER — Other Ambulatory Visit: Payer: Self-pay

## 2021-06-16 MED FILL — Hydroxyzine Pamoate Cap 25 MG: ORAL | 3 days supply | Qty: 12 | Fill #5 | Status: AC

## 2021-06-21 ENCOUNTER — Other Ambulatory Visit: Payer: Self-pay

## 2021-06-26 ENCOUNTER — Other Ambulatory Visit: Payer: Self-pay

## 2021-06-26 MED ORDER — OMEPRAZOLE 20 MG PO CPDR
DELAYED_RELEASE_CAPSULE | Freq: Every day | ORAL | 0 refills | Status: DC
Start: 2021-06-26 — End: 2023-07-07
  Filled 2021-06-26: qty 90, 90d supply, fill #0

## 2021-06-28 ENCOUNTER — Other Ambulatory Visit: Payer: Self-pay

## 2021-06-28 MED ORDER — HYDROXYZINE PAMOATE 25 MG PO CAPS
ORAL_CAPSULE | ORAL | 1 refills | Status: AC
  Filled 2021-06-28: qty 120, 30d supply, fill #0
  Filled 2021-07-24 – 2021-11-10 (×2): qty 120, 30d supply, fill #1

## 2021-06-28 MED ORDER — CYCLOBENZAPRINE HCL 10 MG PO TABS
ORAL_TABLET | ORAL | 1 refills | Status: DC
  Filled 2021-06-28: qty 60, 30d supply, fill #0
  Filled 2021-11-10: qty 60, 30d supply, fill #1

## 2021-06-28 MED ORDER — ETODOLAC 500 MG PO TABS
500.0000 mg | ORAL_TABLET | Freq: Two times a day (BID) | ORAL | 1 refills | Status: DC
  Filled 2021-06-28: qty 60, 30d supply, fill #0
  Filled 2021-07-31 – 2021-11-10 (×2): qty 60, 30d supply, fill #1

## 2021-06-29 ENCOUNTER — Other Ambulatory Visit: Payer: Self-pay

## 2021-07-04 ENCOUNTER — Other Ambulatory Visit: Payer: Self-pay

## 2021-07-24 ENCOUNTER — Other Ambulatory Visit: Payer: Self-pay

## 2021-07-31 ENCOUNTER — Other Ambulatory Visit: Payer: Self-pay

## 2021-08-31 ENCOUNTER — Other Ambulatory Visit: Payer: Self-pay

## 2021-11-10 ENCOUNTER — Other Ambulatory Visit: Payer: Self-pay

## 2021-11-13 ENCOUNTER — Other Ambulatory Visit: Payer: Self-pay

## 2022-03-01 ENCOUNTER — Other Ambulatory Visit: Payer: Self-pay

## 2022-03-07 ENCOUNTER — Other Ambulatory Visit: Payer: Self-pay

## 2022-05-04 ENCOUNTER — Other Ambulatory Visit: Payer: Self-pay

## 2022-11-28 ENCOUNTER — Emergency Department
Admission: EM | Admit: 2022-11-28 | Discharge: 2022-11-28 | Disposition: A | Discharge status: Home / Self Care | Payer: Medicaid Other | Attending: Emergency Medicine | Admitting: Emergency Medicine

## 2022-11-28 ENCOUNTER — Emergency Department: Payer: Medicaid Other

## 2022-11-28 ENCOUNTER — Other Ambulatory Visit: Payer: Self-pay

## 2022-11-28 DIAGNOSIS — G8929 Other chronic pain: Secondary | ICD-10-CM

## 2022-11-28 DIAGNOSIS — D696 Thrombocytopenia, unspecified: Secondary | ICD-10-CM | POA: Diagnosis not present

## 2022-11-28 DIAGNOSIS — E876 Hypokalemia: Secondary | ICD-10-CM | POA: Diagnosis not present

## 2022-11-28 DIAGNOSIS — D72829 Elevated white blood cell count, unspecified: Secondary | ICD-10-CM | POA: Insufficient documentation

## 2022-11-28 DIAGNOSIS — I10 Essential (primary) hypertension: Secondary | ICD-10-CM | POA: Insufficient documentation

## 2022-11-28 LAB — BASIC METABOLIC PANEL
Anion gap: 12 (ref 5–15)
BUN: 15 mg/dL (ref 8–23)
CO2: 22 mmol/L (ref 22–32)
Calcium: 9.2 mg/dL (ref 8.9–10.3)
Chloride: 107 mmol/L (ref 98–111)
Creatinine, Ser: 0.74 mg/dL (ref 0.44–1.00)
GFR, Estimated: >60 mL/min (ref >60)
Glucose, Bld: 109 mg/dL — ABNORMAL HIGH (ref 70–99)
Potassium: 3.3 mmol/L — ABNORMAL LOW (ref 3.5–5.1)
Sodium: 141 mmol/L (ref 135–145)

## 2022-11-28 LAB — CBC WITH DIFFERENTIAL/PLATELET
Abs Immature Granulocytes: 0.06 10*3/uL (ref 0.00–0.07)
Basophils Absolute: 0.1 10*3/uL (ref 0.0–0.1)
Basophils Relative: 1 %
Eosinophils Absolute: 0.1 10*3/uL (ref 0.0–0.5)
Eosinophils Relative: 1 %
HCT: 38 % (ref 36.0–46.0)
Hemoglobin: 12.4 g/dL (ref 12.0–15.0)
Immature Granulocytes: 1 %
Lymphocytes Relative: 16 %
Lymphs Abs: 1.9 10*3/uL (ref 0.7–4.0)
MCH: 29.2 pg (ref 26.0–34.0)
MCHC: 32.6 g/dL (ref 30.0–36.0)
MCV: 89.6 fL (ref 80.0–100.0)
Monocytes Absolute: 0.6 10*3/uL (ref 0.1–1.0)
Monocytes Relative: 5 %
Neutro Abs: 8.9 10*3/uL — ABNORMAL HIGH (ref 1.7–7.7)
Neutrophils Relative %: 76 %
Platelets: 363 10*3/uL (ref 150–400)
RBC: 4.24 MIL/uL (ref 3.87–5.11)
RDW: 13.8 % (ref 11.5–15.5)
WBC: 11.6 10*3/uL — ABNORMAL HIGH (ref 4.0–10.5)
nRBC: 0 % (ref 0.0–0.2)

## 2022-11-28 LAB — TSH: TSH: 2.362 u[IU]/mL (ref 0.350–4.500)

## 2022-11-28 LAB — BRAIN NATRIURETIC PEPTIDE: B Natriuretic Peptide: 81.9 pg/mL (ref 0.0–100.0)

## 2022-11-28 MED ORDER — ETODOLAC 500 MG PO TABS
500.0000 mg | ORAL_TABLET | Freq: Two times a day (BID) | ORAL | 0 refills | Status: DC | PRN
  Filled 2022-11-28: qty 90, 45d supply, fill #0

## 2022-11-28 MED ORDER — GUAIFENESIN ER 600 MG PO TB12
600.0000 mg | ORAL_TABLET | Freq: Two times a day (BID) | ORAL | 0 refills | Status: DC
  Filled 2022-11-28: qty 60, 30d supply, fill #0

## 2022-11-28 MED ORDER — AMLODIPINE BESYLATE 10 MG PO TABS
10.0000 mg | ORAL_TABLET | Freq: Every day | ORAL | 0 refills | Status: DC
Start: 2022-11-28 — End: 2022-12-01
  Filled 2022-11-28: qty 30, 30d supply, fill #0

## 2022-11-28 MED ORDER — AMLODIPINE BESYLATE 5 MG PO TABS
10.0000 mg | ORAL_TABLET | Freq: Once | ORAL | Status: AC
  Administered 2022-11-28: 10 mg via ORAL
  Filled 2022-11-28: qty 2

## 2022-11-28 MED ORDER — POTASSIUM CHLORIDE ER 10 MEQ PO TBCR
10.0000 meq | EXTENDED_RELEASE_TABLET | Freq: Every day | ORAL | 0 refills | Status: DC
Start: 2022-11-28 — End: 2022-12-01
  Filled 2022-11-28: qty 30, 30d supply, fill #0

## 2022-11-28 MED ORDER — BENZONATATE 100 MG PO CAPS
100.0000 mg | ORAL_CAPSULE | Freq: Three times a day (TID) | ORAL | 0 refills | Status: DC | PRN
  Filled 2022-11-28: qty 30, 10d supply, fill #0

## 2022-11-28 MED ORDER — CYCLOBENZAPRINE HCL 10 MG PO TABS
10.0000 mg | ORAL_TABLET | Freq: Three times a day (TID) | ORAL | 0 refills | Status: DC
  Filled 2022-11-28: qty 14, 5d supply, fill #0

## 2022-11-28 NOTE — Discharge Instructions (Addendum)
-  Your blood pressure was elevated here today, though it does not appear to be life-threatening.  Your blood workup, EKG, and chest x-ray was reassuring.  Please continue taking the amlodipine as prescribed.  -In regards to your respiratory symptoms, I suspect that you likely have a persistent bronchitis from a viral infection.  It may take a few weeks for your symptoms to fully resolve.  You may take the benzonatate and the Mucinex for the cough/congestion as needed.  Please remember to hydrate frequently throughout the day.  -You may review the resource guide for the primary care providers nearby to establish with a regular provider for your refills.  You may review the resource guide for nearby shelters as well.  -Return to the emergency department anytime if you begin to experience any new or worsening symptoms.

## 2022-11-28 NOTE — ED Provider Notes (Signed)
[facility] Provider Note    Event Date/Time   First MD Initiated Contact with Patient 11/28/22 1734     (approximate)   History   Chief Complaint Hypertension   HPI Pamela Hanson is a 67 y.o. female, history of hypertension, obesity, hyperlipidemia, pancreatitis, presents to the emergency department for evaluation of hypertension.  Patient states that she had a particularly stressful day today, stating that her car was recently repossessed, which she has been living in recently.  She is currently homeless.  She states that her blood pressure has been very elevated today.  She has not been taking her blood pressure medications for several weeks due to running out of them.  She does not have a current primary care provider.  She is requesting a refill of her amlodipine.  In addition, she states that she has had a persistent cough/congestion for the past few weeks that has not worsened, but has failed to improve.  Denies fever/chills, chest pain, shortness of breath, abdominal pain, flank pain, nausea/vomiting, diarrhea, urinary symptoms, headache, vision change, hearing changes, restless lesions, or dizziness/lightheadedness.  History Limitations: No limitations.        Physical Exam  Triage Vital Signs: ED Triage Vitals [11/28/22 1725]  Enc Vitals Group     BP (!) 215/122     Pulse Rate 95     Resp 18     Temp 98.7 F (37.1 C)     Temp Source Oral     SpO2 97 %     Weight 160 lb (72.6 kg)     Height 4\' 11"  (1.499 m)     Head Circumference      Peak Flow      Pain Score 7     Pain Loc      Pain Edu?      Excl. in [place]?     Most recent vital signs: Vitals:   11/28/22 1917 11/28/22 1951  BP: (!) 210/119 (!) 133/99  Pulse: 90 86  Resp: 18 18  Temp: 98.5 F (36.9 C)   SpO2: 95% 96%    General: Awake, NAD.  Skin: Warm, dry. No rashes or lesions.  Eyes: PERRL. Conjunctivae normal.  CV: Good peripheral perfusion.  S1 and S2 present.  No  murmurs, rubs, or gallops. Resp: Normal effort.  Lung sounds are clear bilaterally.  Mild cough in the room. Abd: Soft, non-tender. No distention.  Neuro: At baseline. No gross neurological deficits.  Cranial nerves II through XII intact.  Normal strength and sensation in both upper and lower extremities. Musculoskeletal: Normal ROM of all extremities.   Physical Exam    ED Results / Procedures / Treatments  Labs (all labs ordered are listed, but only abnormal results are displayed) Labs Reviewed  CBC WITH DIFFERENTIAL/PLATELET - Abnormal; Notable for the following components:      Result Value   WBC 11.6 (*)    Neutro Abs 8.9 (*)    All other components within normal limits  BASIC METABOLIC PANEL - Abnormal; Notable for the following components:   Potassium 3.3 (*)    Glucose, Bld 109 (*)    All other components within normal limits  TSH  BRAIN NATRIURETIC PEPTIDE     EKG Sinus rhythm, rate of 81, no segment changes, normal QRS, no QT prolongation.    RADIOLOGY  ED Provider Interpretation: I personally viewed and interpreted this x-ray, no evidence of acute cardiopulmonary abnormalities.  DG Chest 2 View  Result Date: 11/28/2022 CLINICAL DATA:  Evaluate for pneumonia EXAM: CHEST - 2 VIEW COMPARISON:  Chest x-ray 11/04/2020 FINDINGS: Heart is enlarged, unchanged. There is linear atelectasis or scarring in the mid lungs. There is no focal lung infiltrate, pleural effusion or pneumothorax. No acute fractures are seen. IMPRESSION: 1. No active cardiopulmonary disease. 2. Linear atelectasis or scarring in the mid lungs. Electronically Signed   By: Ronney Asters M.D.   On: 11/28/2022 19:01    PROCEDURES:  Critical Care performed: N/A.  Procedures    MEDICATIONS ORDERED IN ED: Medications  amLODipine (NORVASC) tablet 10 mg (10 mg Oral Given 11/28/22 1815)     IMPRESSION / MDM / ASSESSMENT AND PLAN / ED COURSE  I reviewed the triage vital signs and the nursing  notes.                              Differential diagnosis includes, but is not limited to, hypertension, hypertensive emergency, AKI, bronchitis, community-acquired pneumonia, viral URI, encounter for medication refill.  ED Course Patient appears well, vitals within normal limits.  NAD.  CBC shows slight leukocytosis at 11.6, otherwise no anemia with, cytopenia.  CMP shows mild hypokalemia 3.3, otherwise no significant electrolyte abnormalities or AKI.  BMP unremarkable at 81.9.  TSH unremarkable at 2.362.  Assessment/Plan Patient presents for concern of hypertension.  She is initially found here at 215/122.  Asymptomatic.  She does not have any gross neurological deficits on exam to suggest hypertensive encephalopathy.  Maintaining normal mentation.  Physical exam is overall unremarkable.  Provide her with her home dose of amlodipine 10 mg here.  Appears to improved to 189/159.  Her lab workup is reassuring.  EKG does not show any evidence of ischemia.  Will provide her with a refill of her amlodipine.  Encouraged her to continue taking this medication and to follow-up with her primary care provider.  She states she does not have one at this time.  Provide her with a list of local PCPs.  In regards to her ongoing cough/congestion, her chest x-ray does not show any evidence of pneumonia.  Suspect likely persistent bronchitis.  She is not febrile here today.  Very low suspicion for any bacterial etiology.  Provide her with benzonatate and Mucinex to help manage her symptoms.  She was grateful for this.  She additionally asked for resources regarding local shelters.  Will provide.  Will discharge.  Considered admission for this patient, but given her stable presentation and unremarkable workup, she will have benefit from admission.  Provided the patient with anticipatory guidance, return precautions, and educational material. Encouraged the patient to return to the emergency department at any  time if they begin to experience any new or worsening symptoms. Patient expressed understanding and agreed with the plan.   Patient's presentation is most consistent with acute complicated illness / injury requiring diagnostic workup.       FINAL CLINICAL IMPRESSION(S) / ED DIAGNOSES   Final diagnoses:  Hypertension, unspecified type     Rx / DC Orders   ED Discharge Orders          Ordered    amLODipine (NORVASC) 10 MG tablet  Daily       Note to Pharmacy: 30 day courtesy refill; visit needed for additional refills   11/28/22 1933    potassium chloride (KLOR-CON) 10 MEQ tablet  Daily       Note to Pharmacy: Visit needed  for refills   11/28/22 1933    cyclobenzaprine (FLEXERIL) 10 MG tablet  3 times daily        11/28/22 1933    etodolac (LODINE) 500 MG tablet  2 times daily PRN       Note to Pharmacy: Visit needed for refills   11/28/22 1933    benzonatate (TESSALON PERLES) 100 MG capsule  3 times daily PRN        11/28/22 1933    guaiFENesin (MUCINEX) 600 MG 12 hr tablet  2 times daily        11/28/22 1933             Note:  This document was prepared using Dragon voice recognition software and may include unintentional dictation errors.   Teodoro Spray, [place] 11/28/22 1956    Blake Divine, MD 11/28/22 [phone]

## 2022-11-28 NOTE — ED Triage Notes (Signed)
Pt reports high BP. Pt has been living in her car and not taking her BP meds

## 2022-11-29 ENCOUNTER — Emergency Department
Admission: EM | Admit: 2022-11-29 | Discharge: 2022-11-29 | Disposition: A | Discharge status: Home / Self Care | Payer: Medicaid Other | Attending: Emergency Medicine | Admitting: Emergency Medicine

## 2022-11-29 ENCOUNTER — Emergency Department
Admission: EM | Admit: 2022-11-29 | Discharge: 2022-11-29 | Disposition: A | Discharge status: Home / Self Care | Payer: Medicaid Other | Source: Home / Self Care | Attending: Emergency Medicine | Admitting: Emergency Medicine

## 2022-11-29 ENCOUNTER — Other Ambulatory Visit: Payer: Self-pay

## 2022-11-29 ENCOUNTER — Encounter: Payer: Self-pay | Admitting: Emergency Medicine

## 2022-11-29 DIAGNOSIS — R197 Diarrhea, unspecified: Secondary | ICD-10-CM | POA: Insufficient documentation

## 2022-11-29 DIAGNOSIS — Z5900 Homelessness unspecified: Secondary | ICD-10-CM | POA: Insufficient documentation

## 2022-11-29 DIAGNOSIS — R110 Nausea: Secondary | ICD-10-CM

## 2022-11-29 DIAGNOSIS — E876 Hypokalemia: Secondary | ICD-10-CM | POA: Diagnosis not present

## 2022-11-29 DIAGNOSIS — Z79899 Other long term (current) drug therapy: Secondary | ICD-10-CM | POA: Diagnosis not present

## 2022-11-29 DIAGNOSIS — R112 Nausea with vomiting, unspecified: Secondary | ICD-10-CM | POA: Diagnosis present

## 2022-11-29 LAB — COMPREHENSIVE METABOLIC PANEL
ALT: 15 U/L (ref 0–44)
AST: 24 U/L (ref 15–41)
Albumin: 4.4 g/dL (ref 3.5–5.0)
Alkaline Phosphatase: 92 U/L (ref 38–126)
Anion gap: 9 (ref 5–15)
BUN: 11 mg/dL (ref 8–23)
CO2: 28 mmol/L (ref 22–32)
Calcium: 9.3 mg/dL (ref 8.9–10.3)
Chloride: 103 mmol/L (ref 98–111)
Creatinine, Ser: 0.68 mg/dL (ref 0.44–1.00)
GFR, Estimated: >60 mL/min (ref >60)
Glucose, Bld: 119 mg/dL — ABNORMAL HIGH (ref 70–99)
Potassium: 3 mmol/L — ABNORMAL LOW (ref 3.5–5.1)
Sodium: 140 mmol/L (ref 135–145)
Total Bilirubin: 0.8 mg/dL (ref 0.3–1.2)
Total Protein: 7.9 g/dL (ref 6.5–8.1)

## 2022-11-29 LAB — CBC WITH DIFFERENTIAL/PLATELET
Abs Immature Granulocytes: 0.03 10*3/uL (ref 0.00–0.07)
Basophils Absolute: 0 10*3/uL (ref 0.0–0.1)
Basophils Relative: 0 %
Eosinophils Absolute: 0 10*3/uL (ref 0.0–0.5)
Eosinophils Relative: 0 %
HCT: 40.6 % (ref 36.0–46.0)
Hemoglobin: 13.4 g/dL (ref 12.0–15.0)
Immature Granulocytes: 0 %
Lymphocytes Relative: 22 %
Lymphs Abs: 2.2 10*3/uL (ref 0.7–4.0)
MCH: 29.3 pg (ref 26.0–34.0)
MCHC: 33 g/dL (ref 30.0–36.0)
MCV: 88.6 fL (ref 80.0–100.0)
Monocytes Absolute: 0.6 10*3/uL (ref 0.1–1.0)
Monocytes Relative: 6 %
Neutro Abs: 7.3 10*3/uL (ref 1.7–7.7)
Neutrophils Relative %: 72 %
Platelets: 395 10*3/uL (ref 150–400)
RBC: 4.58 MIL/uL (ref 3.87–5.11)
RDW: 14 % (ref 11.5–15.5)
WBC: 10.2 10*3/uL (ref 4.0–10.5)
nRBC: 0 % (ref 0.0–0.2)

## 2022-11-29 LAB — URINALYSIS, ROUTINE W REFLEX MICROSCOPIC
Bilirubin Urine: NEGATIVE
Glucose, UA: NEGATIVE mg/dL
Hgb urine dipstick: NEGATIVE
Ketones, ur: NEGATIVE mg/dL
Leukocytes,Ua: NEGATIVE
Nitrite: NEGATIVE
Protein, ur: 30 mg/dL — AB
Specific Gravity, Urine: 1.012 (ref 1.005–1.030)
pH: 7 (ref 5.0–8.0)

## 2022-11-29 LAB — URINE DRUG SCREEN, QUALITATIVE (ARMC ONLY)
Amphetamines, Ur Screen: NOT DETECTED
Barbiturates, Ur Screen: NOT DETECTED
Benzodiazepine, Ur Scrn: NOT DETECTED
Cannabinoid 50 Ng, Ur ~~LOC~~: POSITIVE — AB
Cocaine Metabolite,Ur ~~LOC~~: NOT DETECTED
MDMA (Ecstasy)Ur Screen: NOT DETECTED
Methadone Scn, Ur: NOT DETECTED
Opiate, Ur Screen: NOT DETECTED
Phencyclidine (PCP) Ur S: NOT DETECTED
Tricyclic, Ur Screen: NOT DETECTED

## 2022-11-29 MED ORDER — CYCLOBENZAPRINE HCL 10 MG PO TABS
10.0000 mg | ORAL_TABLET | Freq: Once | ORAL | Status: AC
  Administered 2022-11-29: 10 mg via ORAL
  Filled 2022-11-29: qty 1

## 2022-11-29 MED ORDER — CYCLOBENZAPRINE HCL 10 MG PO TABS
5.0000 mg | ORAL_TABLET | Freq: Once | ORAL | Status: AC
  Administered 2022-11-29: 5 mg via ORAL
  Filled 2022-11-29: qty 1

## 2022-11-29 MED ORDER — AMLODIPINE BESYLATE 5 MG PO TABS
10.0000 mg | ORAL_TABLET | Freq: Once | ORAL | Status: AC
  Administered 2022-11-29: 10 mg via ORAL
  Filled 2022-11-29: qty 2

## 2022-11-29 MED ORDER — ONDANSETRON 4 MG PO TBDP
4.0000 mg | ORAL_TABLET | Freq: Once | ORAL | Status: AC
Start: 2022-11-29 — End: 2022-11-29
  Administered 2022-11-29: 4 mg via ORAL
  Filled 2022-11-29: qty 1

## 2022-11-29 MED ORDER — ONDANSETRON 4 MG PO TBDP
4.0000 mg | ORAL_TABLET | Freq: Once | ORAL | Status: AC
  Administered 2022-11-29: 4 mg via ORAL
  Filled 2022-11-29: qty 1

## 2022-11-29 MED ORDER — POTASSIUM CHLORIDE CRYS ER 20 MEQ PO TBCR
40.0000 meq | EXTENDED_RELEASE_TABLET | Freq: Once | ORAL | Status: AC
  Administered 2022-11-29: 40 meq via ORAL
  Filled 2022-11-29: qty 2

## 2022-11-29 MED ORDER — ETODOLAC 200 MG PO CAPS
200.0000 mg | ORAL_CAPSULE | Freq: Once | ORAL | Status: DC
  Filled 2022-11-29: qty 1

## 2022-11-29 MED ORDER — AMLODIPINE BESYLATE 5 MG PO TABS
5.0000 mg | ORAL_TABLET | Freq: Once | ORAL | Status: AC
  Administered 2022-11-29: 5 mg via ORAL
  Filled 2022-11-29: qty 1

## 2022-11-29 MED ORDER — ETODOLAC 200 MG PO CAPS
400.0000 mg | ORAL_CAPSULE | Freq: Once | ORAL | Status: DC
  Filled 2022-11-29: qty 2

## 2022-11-29 NOTE — ED Triage Notes (Signed)
Pt presents via POV with complaints of nausea for the last several weeks. Pt was seen here for same yesterday and she states that her "problem is my pancreas and I was not seen by a doctor". Pt continues with she has been seen here twice and neither time was by a Doctor - pt educated on the ED process and she's seen by the provider who is available at the time and they provide the most efficient care. She is requesting some nausea medication. Denies CP or SOB.

## 2022-11-29 NOTE — ED Notes (Signed)
Pt declined labs stating " I had them done last night".

## 2022-11-29 NOTE — ED Provider Notes (Signed)
   [facility] Provider Note    Event Date/Time   First MD Initiated Contact with Patient 11/29/22 2048     (approximate)   History   Nausea   HPI  Pamela Hanson is a 67 y.o. female who presents with complaint of nausea, she was given ODT Zofran in triage and is feeling better.  Reportedly the patient has been staying in the waiting room since being discharged yesterday because she is homeless.  She states she is feeling fine at this time, requests her home medications     Physical Exam   Triage Vital Signs: ED Triage Vitals  Enc Vitals Group     BP 11/29/22 2016 (!) 178/106     Pulse Rate 11/29/22 2016 100     Resp 11/29/22 2016 18     Temp 11/29/22 2016 99 F (37.2 C)     Temp Source 11/29/22 2016 Oral     SpO2 11/29/22 2016 96 %     Weight 11/29/22 2015 72.5 kg (159 lb 14 oz)     Height 11/29/22 2015 1.499 m (4\' 11" )     Head Circumference --      Peak Flow --      Pain Score 11/29/22 2015 7     Pain Loc --      Pain Edu? --      Excl. in [place]? --     Most recent vital signs: Vitals:   11/29/22 2016  BP: (!) 178/106  Pulse: 100  Resp: 18  Temp: 99 F (37.2 C)  SpO2: 96%     General: Awake, no distress.  CV:  Good peripheral perfusion.  Resp:  Normal effort.  Abd:  No distention.  Soft, nontender Other:     ED Results / Procedures / Treatments   Labs (all labs ordered are listed, but only abnormal results are displayed) Labs Reviewed - No data to display   EKG     RADIOLOGY     PROCEDURES:  Critical Care performed:   Procedures   MEDICATIONS ORDERED IN ED: Medications  etodolac (LODINE) capsule 400 mg (400 mg Oral Not Given 11/29/22 2208)  ondansetron (ZOFRAN-ODT) disintegrating tablet 4 mg (4 mg Oral Given 11/29/22 2020)  amLODipine (NORVASC) tablet 10 mg (10 mg Oral Given 11/29/22 2121)  cyclobenzaprine (FLEXERIL) tablet 5 mg (5 mg Oral Given 11/29/22 2122)     IMPRESSION / MDM / [place] / ED  COURSE  I reviewed the triage vital signs and the nursing notes. Patient's presentation is most consistent with acute, uncomplicated illness.   Patient well-appearing and in no acute distress, lab work reviewed from yesterday which was unremarkable.  Home medications given, no indication for further workup at this time, appropriate for discharge       FINAL CLINICAL IMPRESSION(S) / ED DIAGNOSES   Final diagnoses:  Nausea     Rx / DC Orders   ED Discharge Orders     None        Note:  This document was prepared using Dragon voice recognition software and may include unintentional dictation errors.   Lavonia Drafts, MD 11/29/22 [phone]

## 2022-11-29 NOTE — ED Notes (Signed)
Pt states that she wants different doses of medication. This RN explained that the medication prescribed by the EDP is what the EDP believes is appropriate at this time. Pt refusing to take some of the medication.

## 2022-11-29 NOTE — ED Triage Notes (Addendum)
Pt has been sitting in the lobby since she was discharged at 8pm last night 1/31, pt has asked for water and soda while she was sitting in the lobby, the patient is homeless and does not have a plan for when she is discharged.  Offered the bus, but pt states she is unable to think due to being sick.    Pt did not get prescriptions filled due to not leaving the hospital lobby.  Pt states she has a good standing "federal file"

## 2022-11-29 NOTE — ED Provider Notes (Signed)
[facility] Provider Note    Event Date/Time   First MD Initiated Contact with Patient 11/29/22 0720     (approximate)   History   Nausea   HPI  Pamela Hanson is a 67 y.o. female with history of hypertension, hyperlipidemia, and pancreatitis presents emergency department with nausea and vomiting.  Some diarrhea.  Patient was evaluated yesterday and stayed in the lobby all night.  She is currently homeless as they towed the [place] that she lives in.  Patient is also talking about being poisoned by black tar heroin and states that [employer] put Effexor in her cornbread.  She denies SI/HI      Physical Exam   Triage Vital Signs: ED Triage Vitals [11/29/22 0619]  Enc Vitals Group     BP (!) 210/98     Pulse Rate 86     Resp 18     Temp 98.4 F (36.9 C)     Temp Source Oral     SpO2 98 %     Weight 160 lb (72.6 kg)     Height 4\' 11"  (1.499 m)     Head Circumference      Peak Flow      Pain Score 4     Pain Loc      Pain Edu?      Excl. in [place]?     Most recent vital signs: Vitals:   11/29/22 0619  BP: (!) 210/98  Pulse: 86  Resp: 18  Temp: 98.4 F (36.9 C)  SpO2: 98%     General: Awake, no distress.   CV:  Good peripheral perfusion. regular rate and  rhythm Resp:  Normal effort. Lungs cta Abd:  No distention.   Other:      ED Results / Procedures / Treatments   Labs (all labs ordered are listed, but only abnormal results are displayed) Labs Reviewed  URINALYSIS, ROUTINE W REFLEX MICROSCOPIC - Abnormal; Notable for the following components:      Result Value   Color, Urine YELLOW (*)    APPearance CLEAR (*)    Protein, ur 30 (*)    Bacteria, UA RARE (*)    All other components within normal limits  URINE DRUG SCREEN, QUALITATIVE ([facility] ONLY) - Abnormal; Notable for the following components:   Cannabinoid 50 Ng, Ur [place] POSITIVE (*)    All other components within normal limits  COMPREHENSIVE METABOLIC PANEL - Abnormal;  Notable for the following components:   Potassium 3.0 (*)    Glucose, Bld 119 (*)    All other components within normal limits  CBC WITH DIFFERENTIAL/PLATELET     EKG     RADIOLOGY     PROCEDURES:   Procedures   MEDICATIONS ORDERED IN ED: Medications  cyclobenzaprine (FLEXERIL) tablet 10 mg (has no administration in time range)  etodolac (LODINE) capsule 200 mg (has no administration in time range)  amLODipine (NORVASC) tablet 5 mg (has no administration in time range)  ondansetron (ZOFRAN-ODT) disintegrating tablet 4 mg (4 mg Oral Given 11/29/22 0758)  potassium chloride SA (KLOR-CON M) CR tablet 40 mEq (40 mEq Oral Given 11/29/22 0909)     IMPRESSION / MDM / ASSESSMENT AND PLAN / ED COURSE  I reviewed the triage vital signs and the nursing notes.                              Differential diagnosis  includes, but is not limited to, gastroenteritis, UTI, drug use, altered mental status  Patient's presentation is most consistent with acute complicated illness / injury requiring diagnostic workup.   In review of her past medical history I do not see anything that indicates that she has a history of psychiatric issues.  Most of her labs from yesterday are normal however white count was a little elevated 11.6.  Urinalysis was not performed.  Will go ahead and repeat her CBC and metabolic panel along with doing a urinalysis and UDS to evaluate for anything that may cause an altered mental status.  She is not suicidal so if everything returns as normal I feel she will be capable to be discharged   Labs are concerning for hypokalemia, or potassium was 3.3 yesterday and is now 3.  Will give her K. Dur 40 mEq prior to discharge.  Urinalysis does show cannabinoids.  No other drugs noted in her urinalysis.  Remainder labs are reassuring  Patient appears to be very lucid when I am talking to her.  Still denying SI/HI.  I do not think she would benefit from an admission.  Offered  information about shelters in the area of which she does not want.  She is to follow-up with her regular doctor.  Return emergency department worsening.  Discharged in stable condition.   FINAL CLINICAL IMPRESSION(S) / ED DIAGNOSES   Final diagnoses:  Nausea  Hypokalemia     Rx / DC Orders   ED Discharge Orders     None        Note:  This document was prepared using Dragon voice recognition software and may include unintentional dictation errors.    Versie Starks, PA-C 11/29/22 0623    Carrie Mew, MD 11/30/22 [phone]

## 2022-11-30 DIAGNOSIS — X58XXXA Exposure to other specified factors, initial encounter: Secondary | ICD-10-CM | POA: Insufficient documentation

## 2022-11-30 DIAGNOSIS — Z5900 Homelessness unspecified: Secondary | ICD-10-CM | POA: Insufficient documentation

## 2022-11-30 DIAGNOSIS — S86912A Strain of unspecified muscle(s) and tendon(s) at lower leg level, left leg, initial encounter: Secondary | ICD-10-CM | POA: Insufficient documentation

## 2022-11-30 DIAGNOSIS — Z765 Malingerer [conscious simulation]: Secondary | ICD-10-CM | POA: Diagnosis not present

## 2022-11-30 DIAGNOSIS — R252 Cramp and spasm: Secondary | ICD-10-CM | POA: Insufficient documentation

## 2022-11-30 DIAGNOSIS — S8992XA Unspecified injury of left lower leg, initial encounter: Secondary | ICD-10-CM | POA: Diagnosis present

## 2022-12-01 ENCOUNTER — Other Ambulatory Visit: Payer: Self-pay

## 2022-12-01 ENCOUNTER — Emergency Department
Admission: EM | Admit: 2022-12-01 | Discharge: 2022-12-01 | Disposition: A | Discharge status: Home / Self Care | Payer: Medicaid Other | Attending: Emergency Medicine | Admitting: Emergency Medicine

## 2022-12-01 DIAGNOSIS — Z5900 Homelessness unspecified: Secondary | ICD-10-CM

## 2022-12-01 DIAGNOSIS — G8929 Other chronic pain: Secondary | ICD-10-CM

## 2022-12-01 DIAGNOSIS — Z765 Malingerer [conscious simulation]: Secondary | ICD-10-CM

## 2022-12-01 DIAGNOSIS — I10 Essential (primary) hypertension: Secondary | ICD-10-CM

## 2022-12-01 DIAGNOSIS — T148XXA Other injury of unspecified body region, initial encounter: Secondary | ICD-10-CM

## 2022-12-01 MED ORDER — POTASSIUM CHLORIDE ER 10 MEQ PO TBCR: 10.0000 meq | EXTENDED_RELEASE_TABLET | Freq: Every day | ORAL | 0 refills | Status: DC

## 2022-12-01 MED ORDER — GUAIFENESIN ER 600 MG PO TB12: 600.0000 mg | ORAL_TABLET | Freq: Two times a day (BID) | ORAL | 0 refills | Status: AC

## 2022-12-01 MED ORDER — BENZONATATE 100 MG PO CAPS: 100.0000 mg | ORAL_CAPSULE | Freq: Three times a day (TID) | ORAL | 0 refills | Status: DC | PRN

## 2022-12-01 MED ORDER — ETODOLAC 500 MG PO TABS: 500.0000 mg | ORAL_TABLET | Freq: Two times a day (BID) | ORAL | 0 refills | Status: DC | PRN

## 2022-12-01 MED ORDER — AMLODIPINE BESYLATE 10 MG PO TABS: 10.0000 mg | ORAL_TABLET | Freq: Every day | ORAL | 0 refills | Status: DC

## 2022-12-01 MED ORDER — CYCLOBENZAPRINE HCL 10 MG PO TABS: 10.0000 mg | ORAL_TABLET | Freq: Three times a day (TID) | ORAL | 0 refills | Status: DC

## 2022-12-01 NOTE — ED Triage Notes (Signed)
Pt presents to ER with c/o HTN, back pain and left knee pain.  Pt states she has been seen here 3x since 1/31 for these symptoms.  Pt states she is having "muscle spasms" in her abdomen which are new today.  Pt is otherwise A&O x4 at this time in NAD and ambulatory to triage.

## 2022-12-01 NOTE — Discharge Instructions (Signed)
Please take the prescriptions you were provided to any pharmacy that works for you, although we recommend going to the outpatient pharmacy at [facility] as previously recommended.  While we are sorry that you are going through a very difficult time right now, the emergency department cannot provide additional assistance at this time and there is no indication you have an emergent medical condition.  Please follow-up as an outpatient and refer to the provided resource guides for additional assistance.

## 2022-12-01 NOTE — ED Notes (Signed)
RN in room while MD talked with and examined patient.  Pt reported ongoing issues which she has been seen for multiple times this week.  Pt denies any changes.

## 2022-12-01 NOTE — ED Provider Notes (Signed)
[facility] Provider Note    Event Date/Time   First MD Initiated Contact with Patient 12/01/22 0114     (approximate)   History   Hypertension and Back Pain   HPI  Pamela Hanson is a 67 y.o. female  known to be homeless and who recently lost the vehicle in which she was living.  She presents tonight for the fourth time  in four days for a variety of complaints.  Of note, after her first or second visit, it was discovered that she was staying in the ED waiting room, and allegedly after some conversations about the inappropriateness of her continued presence in on campus after begin discharged, steps were taken to trespass her due to malingering.  She presents tonight reporting that she is still having muscle cramps or strain in her left leg, for which she has been previously evaluated.  She also said that she sometimes has muscle cramps in her abdomen.  She said that she still has not been able to fill her prescriptions which have been provided to her on prior visits.  She also said that she has not been able to get into any shelters.  She said that she does not have her regular medications such as her anti-inflammatory medicines and her muscle relaxer.      Physical Exam   Triage Vital Signs: ED Triage Vitals  Enc Vitals Group     BP 12/01/22 0011 (!) 158/90     Pulse Rate 12/01/22 0011 90     Resp 12/01/22 0011 15     Temp 12/01/22 0011 98.4 F (36.9 C)     Temp Source 12/01/22 0011 Oral     SpO2 12/01/22 0011 95 %     Weight 12/01/22 0012 72.5 kg (159 lb 13.3 oz)     Height --      Head Circumference --      Peak Flow --      Pain Score 12/01/22 0011 7     Pain Loc --      Pain Edu? --      Excl. in [place]? --     Most recent vital signs: Vitals:   12/01/22 0011 12/01/22 0115  BP: (!) 158/90 (!) 109/51  Pulse: 90 73  Resp: 15 16  Temp: 98.4 F (36.9 C)   SpO2: 95% 95%     General: Awake, no distress.  Generally well-appearing and  well-kempt. CV:  Good peripheral perfusion.  Regular rate and rhythm. Resp:  Normal effort. Speaking easily and comfortably, no accessory muscle usage nor intercostal retractions.  Lungs are clear to auscultation bilaterally. Abd:  No distention.  No tenderness to palpation. Other:  Ambulatory with a slight limp on her left leg consistent with her prior left leg pain Psych:  Initially the patient was polite and conversational.  She became displeased and argumentative as our conversation continued (see hospital course).  At no point did she endorse suicidal ideation or homicidal ideation.   ED Results / Procedures / Treatments   Labs (all labs ordered are listed, but only abnormal results are displayed) Labs Reviewed - No data to display    [facility] ED: Medications - No data to display   IMPRESSION / MDM / [place] / ED COURSE  I reviewed the triage vital signs and the nursing notes.  Differential diagnosis includes, but is not limited to, malingering, secondary gain, exacerbation of chronic medical issues.  Patient's presentation is most consistent with exacerbation of chronic illness.  Patient's vital signs are stable and within normal limits.  Initially she was slightly hypertensive but that resolved when she was in a bed.  I reviewed her prior ED visits and I see that on multiple prior visits she has requested doses of her regular medication.  She is not on any blood thinners or rate/rhythm control medications.  Usually she wants her anti-inflammatories and her muscle relaxer.  I explained to her that there is no evidence she has an emergent medical condition and that her medical screening exam is reassuring.  I explained that she had the entire previous week after 3 other visits to the emergency department during which she could have followed up with the outpatient pharmacy or any other pharmacy of her choosing.  However it is  now Friday night in the emergency department and we do not have the option of dispensing her medicines.  I explained that it is not appropriate for [place] to continue giving her daily doses of her medications in the ED and that this is for acute or emergent medical conditions.  We would not be providing any additional medication doses to her.  I reminded her that continued abuse of the healthcare system will not be tolerated and that while she can return for additional evaluation for new or emergent conditions, there is no indication for additional treatment now.  She was very displeased by this and began arguing with me, but I again explained that we would provide information about shelters and various social services and as an attempt to help her, I reprinted her prescriptions which she was previously provided electronically so that she can take them to any pharmacy of her choice.  She left voluntarily in the company of security due to the trespass order placed against her and I am reassured that there is no indication for additional evaluation or treatment at this time.   FINAL CLINICAL IMPRESSION(S) / ED DIAGNOSES   Final diagnoses:  Muscle strain  Homelessness  Malingering     Rx / DC Orders   ED Discharge Orders          Ordered    amLODipine (NORVASC) 10 MG tablet  Daily       Note to Pharmacy: 30 day courtesy refill; visit needed for additional refills   12/01/22 0132    benzonatate (TESSALON PERLES) 100 MG capsule  3 times daily PRN        12/01/22 0132    cyclobenzaprine (FLEXERIL) 10 MG tablet  3 times daily        12/01/22 0132    etodolac (LODINE) 500 MG tablet  2 times daily PRN       Note to Pharmacy: Visit needed for refills   12/01/22 0132    guaiFENesin (MUCINEX) 600 MG 12 hr tablet  2 times daily        12/01/22 0132    potassium chloride (KLOR-CON) 10 MEQ tablet  Daily       Note to Pharmacy: Visit needed for refills   12/01/22 0132             Note:  This  document was prepared using Dragon voice recognition software and may include unintentional dictation errors.   Hinda Kehr, MD 12/01/22 [phone]

## 2022-12-03 ENCOUNTER — Other Ambulatory Visit: Payer: Self-pay

## 2023-03-11 ENCOUNTER — Emergency Department
Admission: EM | Admit: 2023-03-11 | Discharge: 2023-03-11 | Disposition: A | Discharge status: Home / Self Care | Payer: Medicaid Other | Attending: Emergency Medicine | Admitting: Emergency Medicine

## 2023-03-11 ENCOUNTER — Other Ambulatory Visit: Payer: Self-pay

## 2023-03-11 DIAGNOSIS — G8929 Other chronic pain: Secondary | ICD-10-CM

## 2023-03-11 DIAGNOSIS — Z79899 Other long term (current) drug therapy: Secondary | ICD-10-CM | POA: Diagnosis not present

## 2023-03-11 DIAGNOSIS — I10 Essential (primary) hypertension: Secondary | ICD-10-CM | POA: Diagnosis present

## 2023-03-11 DIAGNOSIS — Z760 Encounter for issue of repeat prescription: Secondary | ICD-10-CM

## 2023-03-11 MED ORDER — CYCLOBENZAPRINE HCL 10 MG PO TABS
10.0000 mg | ORAL_TABLET | Freq: Three times a day (TID) | ORAL | 0 refills | Status: DC
  Filled 2023-03-11: qty 21, 7d supply, fill #0

## 2023-03-11 MED ORDER — MELOXICAM 15 MG PO TABS
15.0000 mg | ORAL_TABLET | Freq: Every day | ORAL | 2 refills | Status: DC
  Filled 2023-03-11: qty 30, 30d supply, fill #0

## 2023-03-11 MED ORDER — ETODOLAC 500 MG PO TABS
500.0000 mg | ORAL_TABLET | Freq: Two times a day (BID) | ORAL | 1 refills | Status: DC | PRN
  Filled 2023-03-11: qty 60, 30d supply, fill #0

## 2023-03-11 MED ORDER — AMLODIPINE BESYLATE 10 MG PO TABS
10.0000 mg | ORAL_TABLET | Freq: Every day | ORAL | 2 refills | Status: DC
  Filled 2023-03-11: qty 30, 30d supply, fill #0

## 2023-03-11 MED ORDER — POTASSIUM CHLORIDE ER 10 MEQ PO TBCR
10.0000 meq | EXTENDED_RELEASE_TABLET | Freq: Every day | ORAL | 0 refills | Status: DC
  Filled 2023-03-11: qty 30, 30d supply, fill #0

## 2023-03-11 NOTE — ED Triage Notes (Signed)
Pt to ED [facility] for HTN. Pt homeless. Does not have meds.

## 2023-03-11 NOTE — ED Provider Notes (Signed)
[facility] Provider Note    Event Date/Time   First MD Initiated Contact with Patient 03/11/23 [id number]     (approximate)   History   Medication refill   HPI  Pamela Hanson is a 67 y.o. female with a history of hypertension who presents for medication refill.  Patient reports that she is out of her medications including her amlodipine, etodolac, Flexeril, potassium.  She feels well and has no complaints.  She is here for medication refill.     Physical Exam   Triage Vital Signs: ED Triage Vitals  Enc Vitals Group     BP      Pulse      Resp      Temp      Temp src      SpO2      Weight      Height      Head Circumference      Peak Flow      Pain Score      Pain Loc      Pain Edu?      Excl. in GC?     Most recent vital signs: Vitals:   03/11/23 0916 03/11/23 0919  BP:  (!) 214/125  Pulse: 98   Resp: 18   Temp: 98.6 F (37 C)   SpO2: 100%      General: Awake, no distress.  CV:  Good peripheral perfusion.  Resp:  Normal effort.  Abd:  No distention.  Other:     ED Results / Procedures / Treatments   Labs (all labs ordered are listed, but only abnormal results are displayed) Labs Reviewed - No data to display   EKG     RADIOLOGY     PROCEDURES:  Critical Care performed:   Procedures   MEDICATIONS ORDERED IN ED: Medications - No data to display   IMPRESSION / MDM / ASSESSMENT AND PLAN / ED COURSE  I reviewed the triage vital signs and the nursing notes. Patient's presentation is most consistent with exacerbation of chronic illness.  Patient is here for medication refill.  She is hypertensive and does have a history of hypertension.  I have refilled her medications and sent them to our community pharmacy as she has described difficulty obtaining them in the past.  No indication for lab work or further testing at this time.        FINAL CLINICAL IMPRESSION(S) / ED DIAGNOSES   Final diagnoses:   Encounter for medication refill     Rx / DC Orders   ED Discharge Orders          Ordered    amLODipine (NORVASC) 10 MG tablet  Daily       Note to Pharmacy: 30 day courtesy refill; visit needed for additional refills   03/11/23 0909    cyclobenzaprine (FLEXERIL) 10 MG tablet  3 times daily        03/11/23 0909    etodolac (LODINE) 500 MG tablet  2 times daily PRN,   Status:  Discontinued       Note to Pharmacy: Visit needed for refills   03/11/23 0909    potassium chloride (KLOR-CON) 10 MEQ tablet  Daily       Note to Pharmacy: Visit needed for refills   03/11/23 0909    Ambulatory Referral to Primary Care (Establish Care)        03/11/23 0909    meloxicam (MOBIC) 15 MG tablet  Daily        03/11/23 0915             Note:  This document was prepared using Dragon voice recognition software and may include unintentional dictation errors.   Jene Every, MD 03/11/23 [phone]

## 2023-03-11 NOTE — ED Triage Notes (Signed)
First nurse note: Patient arrived by EMS from [place]. Patient is homeless. C/o hypertension and out of her meds. Ambulatory with EMS with NAD noted  EMS vitals: 155/107 b/p 111 HR 98% RA

## 2023-03-11 NOTE — ED Notes (Signed)
Discharged by MD Cyril Loosen

## 2023-06-13 ENCOUNTER — Other Ambulatory Visit: Payer: Self-pay

## 2023-07-05 ENCOUNTER — Inpatient Hospital Stay
Admission: EM | Admit: 2023-07-05 | Discharge: 2023-07-07 | DRG: 305 | Disposition: A | Discharge status: Home / Self Care | Payer: Medicare Other | Attending: Internal Medicine | Admitting: Internal Medicine

## 2023-07-05 ENCOUNTER — Other Ambulatory Visit: Payer: Self-pay

## 2023-07-05 ENCOUNTER — Emergency Department: Payer: Medicare Other

## 2023-07-05 DIAGNOSIS — F419 Anxiety disorder, unspecified: Secondary | ICD-10-CM | POA: Diagnosis present

## 2023-07-05 DIAGNOSIS — J449 Chronic obstructive pulmonary disease, unspecified: Secondary | ICD-10-CM | POA: Diagnosis present

## 2023-07-05 DIAGNOSIS — E785 Hyperlipidemia, unspecified: Secondary | ICD-10-CM | POA: Diagnosis present

## 2023-07-05 DIAGNOSIS — G8929 Other chronic pain: Secondary | ICD-10-CM | POA: Diagnosis present

## 2023-07-05 DIAGNOSIS — T465X6A Underdosing of other antihypertensive drugs, initial encounter: Secondary | ICD-10-CM | POA: Diagnosis present

## 2023-07-05 DIAGNOSIS — I10 Essential (primary) hypertension: Secondary | ICD-10-CM | POA: Diagnosis present

## 2023-07-05 DIAGNOSIS — K219 Gastro-esophageal reflux disease without esophagitis: Secondary | ICD-10-CM | POA: Diagnosis not present

## 2023-07-05 DIAGNOSIS — Z79899 Other long term (current) drug therapy: Secondary | ICD-10-CM

## 2023-07-05 DIAGNOSIS — E6609 Other obesity due to excess calories: Secondary | ICD-10-CM

## 2023-07-05 DIAGNOSIS — I509 Heart failure, unspecified: Principal | ICD-10-CM

## 2023-07-05 DIAGNOSIS — Z91148 Patient's other noncompliance with medication regimen for other reason: Secondary | ICD-10-CM

## 2023-07-05 DIAGNOSIS — R609 Edema, unspecified: Secondary | ICD-10-CM | POA: Diagnosis present

## 2023-07-05 DIAGNOSIS — M545 Low back pain, unspecified: Secondary | ICD-10-CM | POA: Diagnosis not present

## 2023-07-05 DIAGNOSIS — Z821 Family history of blindness and visual loss: Secondary | ICD-10-CM

## 2023-07-05 DIAGNOSIS — I160 Hypertensive urgency: Secondary | ICD-10-CM | POA: Diagnosis not present

## 2023-07-05 DIAGNOSIS — Z87891 Personal history of nicotine dependence: Secondary | ICD-10-CM

## 2023-07-05 DIAGNOSIS — R0602 Shortness of breath: Secondary | ICD-10-CM | POA: Diagnosis not present

## 2023-07-05 DIAGNOSIS — Z825 Family history of asthma and other chronic lower respiratory diseases: Secondary | ICD-10-CM

## 2023-07-05 DIAGNOSIS — E7800 Pure hypercholesterolemia, unspecified: Secondary | ICD-10-CM | POA: Diagnosis present

## 2023-07-05 DIAGNOSIS — Z6835 Body mass index (BMI) 35.0-35.9, adult: Secondary | ICD-10-CM

## 2023-07-05 DIAGNOSIS — Z8249 Family history of ischemic heart disease and other diseases of the circulatory system: Secondary | ICD-10-CM

## 2023-07-05 DIAGNOSIS — R197 Diarrhea, unspecified: Secondary | ICD-10-CM | POA: Diagnosis present

## 2023-07-05 DIAGNOSIS — Z9049 Acquired absence of other specified parts of digestive tract: Secondary | ICD-10-CM

## 2023-07-05 DIAGNOSIS — R600 Localized edema: Secondary | ICD-10-CM | POA: Diagnosis not present

## 2023-07-05 DIAGNOSIS — Z5900 Homelessness unspecified: Secondary | ICD-10-CM

## 2023-07-05 DIAGNOSIS — Z885 Allergy status to narcotic agent status: Secondary | ICD-10-CM

## 2023-07-05 DIAGNOSIS — M5136 Other intervertebral disc degeneration, lumbar region: Secondary | ICD-10-CM | POA: Diagnosis present

## 2023-07-05 LAB — HEPATIC FUNCTION PANEL
ALT: 30 U/L (ref 0–44)
AST: 28 U/L (ref 15–41)
Albumin: 4.4 g/dL (ref 3.5–5.0)
Alkaline Phosphatase: 91 U/L (ref 38–126)
Bilirubin, Direct: <0.1 mg/dL (ref 0.0–0.2)
Total Bilirubin: 0.8 mg/dL (ref 0.3–1.2)
Total Protein: 7.6 g/dL (ref 6.5–8.1)

## 2023-07-05 LAB — BASIC METABOLIC PANEL
Anion gap: 13 (ref 5–15)
BUN: 11 mg/dL (ref 8–23)
CO2: 26 mmol/L (ref 22–32)
Calcium: 9.5 mg/dL (ref 8.9–10.3)
Chloride: 101 mmol/L (ref 98–111)
Creatinine, Ser: 0.82 mg/dL (ref 0.44–1.00)
GFR, Estimated: >60 mL/min (ref >60)
Glucose, Bld: 105 mg/dL — ABNORMAL HIGH (ref 70–99)
Potassium: 4.2 mmol/L (ref 3.5–5.1)
Sodium: 140 mmol/L (ref 135–145)

## 2023-07-05 LAB — CBC
HCT: 37.3 % (ref 36.0–46.0)
Hemoglobin: 12.1 g/dL (ref 12.0–15.0)
MCH: 29.7 pg (ref 26.0–34.0)
MCHC: 32.4 g/dL (ref 30.0–36.0)
MCV: 91.6 fL (ref 80.0–100.0)
Platelets: 396 10*3/uL (ref 150–400)
RBC: 4.07 MIL/uL (ref 3.87–5.11)
RDW: 14.6 % (ref 11.5–15.5)
WBC: 9 10*3/uL (ref 4.0–10.5)
nRBC: 0 % (ref 0.0–0.2)

## 2023-07-05 LAB — BRAIN NATRIURETIC PEPTIDE: B Natriuretic Peptide: 142.7 pg/mL — ABNORMAL HIGH (ref 0.0–100.0)

## 2023-07-05 LAB — TROPONIN I (HIGH SENSITIVITY): Troponin I (High Sensitivity): 13 ng/L (ref <18)

## 2023-07-05 MED ORDER — ACETAMINOPHEN 650 MG RE SUPP: 650.0000 mg | Freq: Four times a day (QID) | RECTAL | Status: DC | PRN

## 2023-07-05 MED ORDER — ACETAMINOPHEN 325 MG PO TABS: 650.0000 mg | ORAL_TABLET | Freq: Four times a day (QID) | ORAL | Status: DC | PRN

## 2023-07-05 MED ORDER — FUROSEMIDE 10 MG/ML IJ SOLN
40.0000 mg | Freq: Once | INTRAMUSCULAR | Status: AC
  Administered 2023-07-05: 40 mg via INTRAVENOUS
  Filled 2023-07-05: qty 4

## 2023-07-05 MED ORDER — ONDANSETRON HCL 4 MG PO TABS: 4.0000 mg | ORAL_TABLET | Freq: Four times a day (QID) | ORAL | Status: DC | PRN

## 2023-07-05 MED ORDER — ONDANSETRON HCL 4 MG/2ML IJ SOLN: 4.0000 mg | Freq: Four times a day (QID) | INTRAMUSCULAR | Status: DC | PRN

## 2023-07-05 MED ORDER — ENOXAPARIN SODIUM 40 MG/0.4ML IJ SOSY
40.0000 mg | PREFILLED_SYRINGE | INTRAMUSCULAR | Status: DC
  Filled 2023-07-05 (×2): qty 0.4

## 2023-07-05 MED ORDER — LISINOPRIL 20 MG PO TABS
20.0000 mg | ORAL_TABLET | Freq: Every day | ORAL | Status: DC
  Administered 2023-07-06 – 2023-07-07 (×2): 20 mg via ORAL
  Filled 2023-07-05 (×2): qty 1

## 2023-07-05 MED ORDER — METOPROLOL TARTRATE 25 MG PO TABS
25.0000 mg | ORAL_TABLET | Freq: Two times a day (BID) | ORAL | Status: DC
  Administered 2023-07-06 (×3): 25 mg via ORAL
  Filled 2023-07-05 (×5): qty 1

## 2023-07-05 MED ORDER — FUROSEMIDE 10 MG/ML IJ SOLN
20.0000 mg | Freq: Two times a day (BID) | INTRAMUSCULAR | Status: DC
  Administered 2023-07-06 (×2): 20 mg via INTRAVENOUS
  Filled 2023-07-05 (×2): qty 2

## 2023-07-05 MED ORDER — LABETALOL HCL 5 MG/ML IV SOLN
10.0000 mg | Freq: Once | INTRAVENOUS | Status: AC
  Administered 2023-07-05: 10 mg via INTRAVENOUS
  Filled 2023-07-05: qty 4

## 2023-07-05 MED ORDER — MELOXICAM 7.5 MG PO TABS: 15.0000 mg | ORAL_TABLET | Freq: Every day | ORAL | Status: DC
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
## 2023-07-05 NOTE — H&P (Signed)
History and Physical    Patient: Pamela Hanson GMW:[id number] DOB: 01-30-1956 DOA: 07/05/2023 DOS: the patient was seen and examined on 07/05/2023 PCP: Pcp, No  Patient coming from: Home  Chief Complaint:  Chief Complaint  Patient presents with   Hypertension   HPI: BRENNYN Hanson is a 67 y.o. female with medical history significant of essential hypertension, degenerative disc disease, prior history of HCTZ induced pancreatitis, COPD, who presented to the ER complaining of worsening lower extremity edema and some shortness of breath.  Patient apparently has not been taking her blood pressure medicines for almost 3 months.  She ran out of the medications.  She has Medicaid and a new physician was assigned to her but she was unable to get to the physician on time.  When she finally did she could not locate the place.  She is therefore not been on medications.  She has been on amlodipine.  Prior to that she was on hydrochlorothiazide that she believes caused her pancreatitis so it was stopped.  She was given some other diuretic but then taken off of that.  In the ER today patient was found to have bilateral lower extremity 2+ pedal edema.  No evidence of fluid overload in the lungs.  No prior history of CHF.  Patient's blood pressure was also markedly elevated with systolic around 200 diastolic over 100.  She is therefore being admitted for observation and to sort out patient's home medications.   Review of Systems: As mentioned in the history of present illness. All other systems reviewed and are negative. Past Medical History:  Diagnosis Date   Degenerative disc disease, lumbar    Degenerative disc disease, lumbar    Hypertension    Pancreatitis    Past Surgical History:  Procedure Laterality Date   CHOLECYSTECTOMY     Social History:  reports that she has quit smoking. Her smoking use included cigarettes. She has never used smokeless tobacco. She reports that she does not currently  use alcohol. She reports current drug use. Drug: Marijuana.  Allergies  Allergen Reactions   Morphine And Codeine     " I just don't want that in my body"    Family History  Problem Relation Age of Onset   Vision loss Mother    Emphysema Father    Cancer Maternal Grandmother    Heart disease Maternal Grandfather     Prior to Admission medications   Medication Sig Start Date End Date Taking? Authorizing Provider  albuterol (VENTOLIN HFA) 108 (90 Base) MCG/ACT inhaler Inhale 2 puffs into the lungs every 6 (six) hours as needed for shortness of breath. Patient not taking: Reported on 07/05/2023 11/04/20   Menshew, Charlesetta Ivory, PA-C  amLODipine (NORVASC) 10 MG tablet Take 1 tablet (10 mg total) by mouth daily. Patient not taking: Reported on 07/05/2023 03/11/23   Jene Every, MD  benzonatate (TESSALON PERLES) 100 MG capsule Take 1 capsule (100 mg total) by mouth 3 (three) times daily as needed for cough. Patient not taking: Reported on 07/05/2023 12/01/22 12/01/23  Loleta Rose, MD  cyclobenzaprine (FLEXERIL) 10 MG tablet Take 1 tablet (10 mg total) by mouth 3 (three) times daily. Patient not taking: Reported on 07/05/2023 03/11/23   Jene Every, MD  meloxicam (MOBIC) 15 MG tablet Take 1 tablet (15 mg total) by mouth daily. Patient not taking: Reported on 07/05/2023 03/11/23 03/10/24  Jene Every, MD  methylPREDNISolone (MEDROL DOSEPAK) 4 MG TBPK tablet Take 6 pills on day one  then decrease by 1 pill each day Patient not taking: Reported on 07/05/2023 10/25/20   Faythe Ghee, PA-C  omeprazole (PRILOSEC) 20 MG capsule Take 1 capsule (20 mg total) by mouth 2 (two) times daily before a meal. Patient not taking: Reported on 07/05/2023 01/07/20   Doren Custard, FNP  omeprazole (PRILOSEC) 20 MG capsule TAKE ONE CAPSULE BY MOUTH EVERY DAY 06/26/21 02/06/22    potassium chloride (KLOR-CON) 10 MEQ tablet Take 1 tablet (10 mEq total) by mouth daily. Patient not taking: Reported on 07/05/2023 03/11/23    Jene Every, MD  promethazine (PHENERGAN) 25 MG tablet Take 1 tablet (25 mg total) by mouth every 6 (six) hours as needed for nausea or vomiting. 03/31/20 10/25/20  Emily Filbert, MD    Physical Exam: Vitals:   07/05/23 1857 07/05/23 1946 07/05/23 2030 07/05/23 2158  BP: (!) 165/77   (!) 172/92  Pulse: 72  (S) 87 81  Resp: 18   20  Temp:  98.8 F (37.1 C)    TempSrc:  Oral    SpO2: 96%  (S) 94% 98%   Constitutional: Anxious, acutely ill, NAD, comfortable Eyes: PERRL, lids and conjunctivae normal ENMT: Mucous membranes are moist. Posterior pharynx clear of any exudate or lesions.Normal dentition.  Neck: normal, supple, no masses, no thyromegaly Respiratory: clear to auscultation bilaterally, no wheezing, no crackles. Normal respiratory effort. No accessory muscle use.  Cardiovascular: Regular rate and rhythm, no murmurs / rubs / gallops.  2+ extremity edema. 2+ pedal pulses. No carotid bruits.  Abdomen: no tenderness, no masses palpated. No hepatosplenomegaly. Bowel sounds positive.  Musculoskeletal: Good range of motion, no joint swelling or tenderness, Skin: no rashes, lesions, ulcers. No induration Neurologic: CN 2-12 grossly intact. Sensation intact, DTR normal. Strength 5/5 in all 4.  Psychiatric: Normal judgment and insight. Alert and oriented x 3. Normal mood  Data Reviewed:  Glucose 105, BNP 142, CBC within normal.  Chest x-ray showed borderline cardiopericardial silhouette no edema  Assessment and Plan:  #1 bilateral lower extremity edema: Patient could be having that as a result of dependent edema.  She is also on amlodipine prior to not taking it for a while.  The fact that she has not had it for months makes it less likely to be the course.  CHF is a possibility with her uncontrolled hypertension.  Patient will be admitted.  Will gently diurese patient.  Control blood pressure.  Get echocardiogram to evaluate her EF.  Depending on the results may need to be seen by  cardiology.  #2 uncontrolled hypertension: I will take patient off amlodipine with the pedal edema.  She has good renal function.  Initiate metoprolol and lisinopril.  Monitor blood pressure.  Also IV diuretic for now and possibly transition to oral Lasix at discharge.  #3 anxiety disorder: Counseling provided  #4 social issues: Patient will likely need social worker consult in the morning to help with her medication regimen.  She probably needs to be referred to a new PCP that she can afford to visit with.    Advance Care Planning:   Code Status: Full Code   Consults: None  Family Communication: No family at bedside  Severity of Illness: The appropriate patient status for this patient is OBSERVATION. Observation status is judged to be reasonable and necessary in order to provide the required intensity of service to ensure the patient's safety. The patient's presenting symptoms, physical exam findings, and initial radiographic and laboratory data in the context  of their medical condition is felt to place them at decreased risk for further clinical deterioration. Furthermore, it is anticipated that the patient will be medically stable for discharge from the hospital within 2 midnights of admission.   AuthorLonia Blood, MD 07/05/2023 10:29 PM  For on call review [email or web address].
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
## 2023-07-06 ENCOUNTER — Inpatient Hospital Stay (HOSPITAL_COMMUNITY)
Admit: 2023-07-06 | Discharge: 2023-07-06 | Disposition: A | Discharge status: Home / Self Care | Payer: Medicare Other | Attending: Internal Medicine | Admitting: Internal Medicine

## 2023-07-06 DIAGNOSIS — R0602 Shortness of breath: Secondary | ICD-10-CM | POA: Diagnosis present

## 2023-07-06 DIAGNOSIS — G8929 Other chronic pain: Secondary | ICD-10-CM | POA: Diagnosis present

## 2023-07-06 DIAGNOSIS — Z821 Family history of blindness and visual loss: Secondary | ICD-10-CM | POA: Diagnosis not present

## 2023-07-06 DIAGNOSIS — T465X6A Underdosing of other antihypertensive drugs, initial encounter: Secondary | ICD-10-CM | POA: Diagnosis present

## 2023-07-06 DIAGNOSIS — Z87891 Personal history of nicotine dependence: Secondary | ICD-10-CM | POA: Diagnosis not present

## 2023-07-06 DIAGNOSIS — Z8249 Family history of ischemic heart disease and other diseases of the circulatory system: Secondary | ICD-10-CM | POA: Diagnosis not present

## 2023-07-06 DIAGNOSIS — Z9049 Acquired absence of other specified parts of digestive tract: Secondary | ICD-10-CM | POA: Diagnosis not present

## 2023-07-06 DIAGNOSIS — Z91148 Patient's other noncompliance with medication regimen for other reason: Secondary | ICD-10-CM | POA: Diagnosis not present

## 2023-07-06 DIAGNOSIS — I5030 Unspecified diastolic (congestive) heart failure: Secondary | ICD-10-CM

## 2023-07-06 DIAGNOSIS — R197 Diarrhea, unspecified: Secondary | ICD-10-CM | POA: Diagnosis present

## 2023-07-06 DIAGNOSIS — R600 Localized edema: Secondary | ICD-10-CM | POA: Diagnosis not present

## 2023-07-06 DIAGNOSIS — Z825 Family history of asthma and other chronic lower respiratory diseases: Secondary | ICD-10-CM | POA: Diagnosis not present

## 2023-07-06 DIAGNOSIS — Z6835 Body mass index (BMI) 35.0-35.9, adult: Secondary | ICD-10-CM | POA: Diagnosis not present

## 2023-07-06 DIAGNOSIS — J449 Chronic obstructive pulmonary disease, unspecified: Secondary | ICD-10-CM | POA: Diagnosis present

## 2023-07-06 DIAGNOSIS — M5136 Other intervertebral disc degeneration, lumbar region: Secondary | ICD-10-CM | POA: Diagnosis present

## 2023-07-06 DIAGNOSIS — Z885 Allergy status to narcotic agent status: Secondary | ICD-10-CM | POA: Diagnosis not present

## 2023-07-06 DIAGNOSIS — M545 Low back pain, unspecified: Secondary | ICD-10-CM | POA: Diagnosis present

## 2023-07-06 DIAGNOSIS — I10 Essential (primary) hypertension: Secondary | ICD-10-CM | POA: Diagnosis present

## 2023-07-06 DIAGNOSIS — Z79899 Other long term (current) drug therapy: Secondary | ICD-10-CM | POA: Diagnosis not present

## 2023-07-06 DIAGNOSIS — I160 Hypertensive urgency: Secondary | ICD-10-CM | POA: Diagnosis present

## 2023-07-06 DIAGNOSIS — K219 Gastro-esophageal reflux disease without esophagitis: Secondary | ICD-10-CM | POA: Diagnosis present

## 2023-07-06 DIAGNOSIS — F419 Anxiety disorder, unspecified: Secondary | ICD-10-CM | POA: Diagnosis present

## 2023-07-06 DIAGNOSIS — E7800 Pure hypercholesterolemia, unspecified: Secondary | ICD-10-CM | POA: Diagnosis present

## 2023-07-06 DIAGNOSIS — Z5900 Homelessness unspecified: Secondary | ICD-10-CM | POA: Diagnosis not present

## 2023-07-06 DIAGNOSIS — E6609 Other obesity due to excess calories: Secondary | ICD-10-CM | POA: Diagnosis present

## 2023-07-06 LAB — ECHOCARDIOGRAM COMPLETE
Area-P 1/2: 3.89 cm2
Calc EF: 53.8 %
Height: 59 in
S' Lateral: 3.2 cm
Single Plane A2C EF: 55.5 %
Single Plane A4C EF: 50.4 %
Weight: 2809.6 oz

## 2023-07-06 LAB — BASIC METABOLIC PANEL
Anion gap: 10 (ref 5–15)
BUN: 9 mg/dL (ref 8–23)
CO2: 29 mmol/L (ref 22–32)
Calcium: 8.5 mg/dL — ABNORMAL LOW (ref 8.9–10.3)
Chloride: 104 mmol/L (ref 98–111)
Creatinine, Ser: 0.77 mg/dL (ref 0.44–1.00)
GFR, Estimated: >60 mL/min (ref >60)
Glucose, Bld: 98 mg/dL (ref 70–99)
Potassium: 3.2 mmol/L — ABNORMAL LOW (ref 3.5–5.1)
Sodium: 143 mmol/L (ref 135–145)

## 2023-07-06 LAB — CBC
HCT: 33 % — ABNORMAL LOW (ref 36.0–46.0)
Hemoglobin: 10.8 g/dL — ABNORMAL LOW (ref 12.0–15.0)
MCH: 29.9 pg (ref 26.0–34.0)
MCHC: 32.7 g/dL (ref 30.0–36.0)
MCV: 91.4 fL (ref 80.0–100.0)
Platelets: 332 10*3/uL (ref 150–400)
RBC: 3.61 MIL/uL — ABNORMAL LOW (ref 3.87–5.11)
RDW: 14.8 % (ref 11.5–15.5)
WBC: 7.1 10*3/uL (ref 4.0–10.5)
nRBC: 0 % (ref 0.0–0.2)

## 2023-07-06 LAB — TSH: TSH: 3.83 u[IU]/mL (ref 0.350–4.500)

## 2023-07-06 LAB — HIV ANTIBODY (ROUTINE TESTING W REFLEX): HIV Screen 4th Generation wRfx: NONREACTIVE

## 2023-07-06 MED ORDER — LIDOCAINE 5 % EX PTCH
2.0000 | MEDICATED_PATCH | CUTANEOUS | Status: DC
  Administered 2023-07-06: 2 via TRANSDERMAL
  Filled 2023-07-06 (×2): qty 2

## 2023-07-06 MED ORDER — DICLOFENAC SODIUM 25 MG PO TBEC
50.0000 mg | DELAYED_RELEASE_TABLET | Freq: Three times a day (TID) | ORAL | Status: DC
  Filled 2023-07-06: qty 2

## 2023-07-06 MED ORDER — HYDROCODONE-ACETAMINOPHEN 5-325 MG PO TABS: 1.0000 | ORAL_TABLET | Freq: Once | ORAL | Status: DC

## 2023-07-06 MED ORDER — METHOCARBAMOL 500 MG PO TABS
500.0000 mg | ORAL_TABLET | Freq: Three times a day (TID) | ORAL | Status: DC
  Filled 2023-07-06: qty 1

## 2023-07-06 MED ORDER — KETOROLAC TROMETHAMINE 15 MG/ML IJ SOLN
15.0000 mg | Freq: Four times a day (QID) | INTRAMUSCULAR | Status: DC
  Administered 2023-07-06: 15 mg via INTRAVENOUS
  Filled 2023-07-06: qty 1

## 2023-07-06 MED ORDER — ETODOLAC 300 MG PO CAPS
500.0000 mg | ORAL_CAPSULE | Freq: Two times a day (BID) | ORAL | Status: DC
  Filled 2023-07-06: qty 1

## 2023-07-06 MED ORDER — IBUPROFEN 400 MG PO TABS
200.0000 mg | ORAL_TABLET | Freq: Once | ORAL | Status: AC
  Administered 2023-07-06: 200 mg via ORAL
  Filled 2023-07-06: qty 1

## 2023-07-06 MED ORDER — OXYCODONE HCL 5 MG PO TABS
5.0000 mg | ORAL_TABLET | Freq: Once | ORAL | Status: AC
  Administered 2023-07-06: 5 mg via ORAL
  Filled 2023-07-06: qty 1

## 2023-07-06 NOTE — Plan of Care (Signed)

## 2023-07-06 NOTE — Progress Notes (Signed)
       CROSS COVER NOTE  NAME: Pamela Hanson MRN: [id number] DOB : 05/06/1956    Concern as stated by nurse / staff   This pt is asking lortab for pain. She said that's what she takes when her pain is real bad. She takes etolac 500 mg that is not in our formulary. They ordered diclodenac, but she said that she does not want to take that as she is afraid of pancreeatitus again as this was a prob in past with diff meds. Also bp 180/80, lopressor scheduled. She refused, as she wants her home amlodopine orderer   She said that she can't take tylenol. Is there hydrocodone combined with ibuprofen?    Pertinent findings on chart review: H & P /plan reviewed  Assessment and  Interventions   Assessment:    07/06/2023    4:57 PM 07/06/2023   11:22 AM 07/06/2023    9:00 AM  Vitals with BMI  Systolic 187 156 086  Diastolic 90 70 80  Pulse 65 59 68    Plan: Amlodipine not ordered secondary to edema Treat her pain with one dose of oxycodone + 200 motrin and reassess bp       Donnie Mesa NP [facility] Cross Cover 7pm-7am - check amion for availability Pager [phone]

## 2023-07-06 NOTE — Plan of Care (Signed)
?  Problem: Clinical Measurements: ?Goal: Ability to maintain clinical measurements within normal limits will improve ?Outcome: Progressing ?Goal: Will remain free from infection ?Outcome: Progressing ?Goal: Diagnostic test results will improve ?Outcome: Progressing ?  ?

## 2023-07-06 NOTE — Progress Notes (Signed)
PROGRESS NOTE    Pamela Hanson  GNF:[id number] DOB: [birth year] DOA: 07/05/2023 PCP: Pcp, No    Brief Narrative:   67 y.o. female with medical history significant of essential hypertension, degenerative disc disease, prior history of HCTZ induced pancreatitis, COPD, who presented to the ER complaining of worsening lower extremity edema and some shortness of breath.  Patient apparently has not been taking her blood pressure medicines for almost 3 months.  She ran out of the medications.  She has Medicaid and a new physician was assigned to her but she was unable to get to the physician on time.  When she finally did she could not locate the place.  She is therefore not been on medications.  She has been on amlodipine.  Prior to that she was on hydrochlorothiazide that she believes caused her pancreatitis so it was stopped.  She was given some other diuretic but then taken off of that.  In the ER today patient was found to have bilateral lower extremity 2+ pedal edema.  No evidence of fluid overload in the lungs.  No prior history of CHF.  Patient's blood pressure was also markedly elevated with systolic around 200 diastolic over 100.  She is therefore being admitted for observation and to sort out patient's home medications.    Assessment & Plan:   Principal Problem:   Edema Active Problems:   Essential hypertension   Class 1 obesity due to excess calories with serious comorbidity and body mass index (BMI) of 33.0 to 33.9 in adult   Chronic bilateral low back pain without sciatica   Hyperlipidemia   Pure hypercholesterolemia   Gastroesophageal reflux disease without esophagitis  Hypertensive urgency Blood pressure control improved Plan: Continue Lasix 20 mg IV twice daily Continue lisinopril 20 mg daily Continue metoprolol 25 mg twice daily Vitals per unit protocol  Bilateral lower extremity edema Could be representative of underlying congestive heart failure considering uncontrolled  hypertension 2D echocardiogram ordered, pending Lasix as above  Obesity BMI 35.47.  This complicates overall care and prognosis   DVT prophylaxis: SQ Lovenox Code Status: Full Family Communication: None Disposition Plan: Status is: Observation The patient will require care spanning > 2 midnights and should be moved to inpatient because: Uncontrolled hypertension.  Pending echocardiogram   Level of care: Telemetry Cardiac  Consultants:  None  Procedures:  None  Antimicrobials: None   Subjective: Seen and examined.  Sitting up in bed.  No visible distress  Objective: Vitals:   07/06/23 0238 07/06/23 0314 07/06/23 0900 07/06/23 1122  BP: (!) 152/73 139/69 (!) 188/80 (!) 156/70  Pulse: 66 61 68 (!) 59  Resp:  19 20 18   Temp:  98.4 F (36.9 C) 98.6 F (37 C) 98.9 F (37.2 C)  TempSrc:   Oral Oral  SpO2:  93% 94% 96%  Weight:      Height:        Intake/Output Summary (Last 24 hours) at 07/06/2023 1402 Last data filed at 07/06/2023 1122 Gross per 24 hour  Intake --  Output 4200 ml  Net -4200 ml   Filed Weights   07/06/23 0153  Weight: 79.7 kg    Examination:  General exam: Appears calm and comfortable  Respiratory system: Clear to auscultation. Respiratory effort normal. Cardiovascular system: S2, RRR, no murmurs, no pedal edema Gastrointestinal system: Obese, soft, NT/ND, normal bowel sounds Central nervous system: Alert and oriented. No focal neurological deficits. Extremities: Symmetric 5 x 5 power. Skin: No rashes, lesions or ulcers  Psychiatry: Judgement and insight appear normal. Mood & affect appropriate.     Data Reviewed: I have personally reviewed following labs and imaging studies  CBC: Recent Labs  Lab 07/05/23 1534 07/06/23 0551  WBC 9.0 7.1  HGB 12.1 10.8*  HCT 37.3 33.0*  MCV 91.6 91.4  PLT 396 332   Basic Metabolic Panel: Recent Labs  Lab 07/05/23 1534 07/06/23 0551  NA 140 143  K 4.2 3.2*  CL 101 104  CO2 26 29  GLUCOSE  105* 98  BUN 11 9  CREATININE 0.82 0.77  CALCIUM 9.5 8.5*   GFR: Estimated Creatinine Clearance: 64 mL/min (by C-G formula based on SCr of 0.77 mg/dL). Liver Function Tests: Recent Labs  Lab 07/05/23 1534  AST 28  ALT 30  ALKPHOS 91  BILITOT 0.8  PROT 7.6  ALBUMIN 4.4   No results for input(s): "LIPASE", "AMYLASE" in the last 168 hours. No results for input(s): "AMMONIA" in the last 168 hours. Coagulation Profile: No results for input(s): "INR", "PROTIME" in the last 168 hours. Cardiac Enzymes: No results for input(s): "CKTOTAL", "CKMB", "CKMBINDEX", "TROPONINI" in the last 168 hours. BNP (last 3 results) No results for input(s): "PROBNP" in the last 8760 hours. HbA1C: No results for input(s): "HGBA1C" in the last 72 hours. CBG: No results for input(s): "GLUCAP" in the last 168 hours. Lipid Profile: No results for input(s): "CHOL", "HDL", "LDLCALC", "TRIG", "CHOLHDL", "LDLDIRECT" in the last 72 hours. Thyroid Function Tests: Recent Labs    07/05/23 1534  TSH 3.830   Anemia Panel: No results for input(s): "VITAMINB12", "FOLATE", "FERRITIN", "TIBC", "IRON", "RETICCTPCT" in the last 72 hours. Sepsis Labs: No results for input(s): "PROCALCITON", "LATICACIDVEN" in the last 168 hours.  No results found for this or any previous visit (from the past 240 hour(s)).       Radiology Studies: DG Chest 2 View  Result Date: 07/05/2023 CLINICAL DATA:  Shortness of breath EXAM: CHEST - 2 VIEW COMPARISON:  11/28/2022 and older FINDINGS: Enlarged cardiopericardial silhouette with calcified tortuous aorta. Hiatal hernia. Mild linear opacities along the midlung zones bilaterally. Likely scar or atelectasis. No consolidation, pneumothorax or effusion. No edema. Degenerative changes along the spine. Surgical clips in the upper abdomen. IMPRESSION: Borderline cardiopericardial silhouette. Midlung atelectasis or scar. Electronically Signed   By: Karen Kays M.D.   On: 07/05/2023 18:18         Scheduled Meds:  enoxaparin (LOVENOX) injection  40 mg Subcutaneous Q24H   furosemide  20 mg Intravenous BID   ketorolac  15 mg Intravenous Q6H   lisinopril  20 mg Oral Daily   metoprolol tartrate  25 mg Oral BID   Continuous Infusions:   LOS: 0 days     Tresa Moore, MD [facility]   If 7PM-7AM, please contact night-coverage  07/06/2023, 2:02 PM

## 2023-07-07 DIAGNOSIS — R600 Localized edema: Secondary | ICD-10-CM

## 2023-07-07 MED ORDER — ETODOLAC 500 MG PO TABS: 500.0000 mg | ORAL_TABLET | Freq: Two times a day (BID) | ORAL | 1 refills | Status: DC | PRN

## 2023-07-07 MED ORDER — IBUPROFEN 400 MG PO TABS
400.0000 mg | ORAL_TABLET | Freq: Once | ORAL | Status: AC
  Administered 2023-07-07: 400 mg via ORAL
  Filled 2023-07-07: qty 1

## 2023-07-07 MED ORDER — LISINOPRIL 20 MG PO TABS: 20.0000 mg | ORAL_TABLET | Freq: Every day | ORAL | 2 refills | Status: DC

## 2023-07-07 MED ORDER — CYCLOBENZAPRINE HCL 10 MG PO TABS: 10.0000 mg | ORAL_TABLET | Freq: Three times a day (TID) | ORAL | 0 refills | Status: DC

## 2023-07-07 MED ORDER — ALBUTEROL SULFATE HFA 108 (90 BASE) MCG/ACT IN AERS
2.0000 | INHALATION_SPRAY | Freq: Four times a day (QID) | RESPIRATORY_TRACT | 0 refills | Status: DC | PRN
Start: 2023-07-07 — End: 2024-08-27

## 2023-07-07 MED ORDER — AMLODIPINE BESYLATE 10 MG PO TABS
10.0000 mg | ORAL_TABLET | Freq: Every day | ORAL | Status: DC
  Administered 2023-07-07: 10 mg via ORAL
  Filled 2023-07-07: qty 1

## 2023-07-07 MED ORDER — AMLODIPINE BESYLATE 10 MG PO TABS: 10.0000 mg | ORAL_TABLET | Freq: Every day | ORAL | 2 refills | Status: DC

## 2023-07-07 NOTE — TOC Progression Note (Addendum)
Transition of Care [facility]) - Progression Note    Patient Details  Name: Pamela Hanson MRN: [id number] Date of Birth: 24-Jun-1956  Transition of Care [facility]) CM/SW Contact  Bing Quarry, RN Phone Number: 07/07/2023, 9:05 AM  Clinical Narrative:  9/8: Admitted via ED on 07/05/23 with Htx of HCTZ induced pancreatitis, COPD,  to ED with complaints of complaining of worsening lower extremity edema and some shortness of breath. Had not been taking some medications, and has not been able to make it to  new Medicaid PCP assigned appointment.    NO PCP listed.  Food, Transportation, Housing SDOH alerts identified and resources placed in AVS.  PCP options list also added to AVS via Care Management.  Patient noted to have Medicare A/B list in [facility] EMR as well.   Discharge orders just in as completing this note.   Gabriel Cirri MSN RN CM  Transitions of Care Department [facility] [phone] Weekends Only          Expected Discharge Plan and Services         Expected Discharge Date: 07/07/23                                     Social Determinants of Health (SDOH) Interventions SDOH Screenings   Food Insecurity: Food Insecurity Present (07/06/2023)  Housing: High Risk (07/06/2023)  Transportation Needs: Unmet Transportation Needs (07/06/2023)  Utilities: Not At Risk (07/06/2023)  Alcohol Screen: Low Risk  (01/26/2020)  Depression (PHQ2-9): Low Risk  (01/26/2020)  Financial Resource Strain: High Risk (06/04/2019)  Physical Activity: Inactive (02/27/2019)  Social Connections: Moderately Isolated (02/27/2019)  Stress: Stress Concern Present (06/04/2019)  Tobacco Use: Medium Risk (07/05/2023)    Readmission Risk Interventions     No data to display

## 2023-07-07 NOTE — Discharge Instructions (Addendum)
Some PCP options in [place] area- not a comprehensive list  [facility]- [phone] [facility]- [phone] [facility]- [phone] [facility]- [phone] [facility]- [phone] [facility]- [phone]  or [facility] Physician Referral Line [phone]   Rent/Utility/Housing  Agency Name: [facility] Agency Address: [address] [place], [place] [address] Phone: [phone] Email: [email or web address] Website: [email or web address] Service(s) Offered: Housing services, self-sufficiency, congregate meal program, weatherization program, Field seismologist program, emergency food assistance,  housing counseling, home ownership program, wheels -towork program.  Agency Name: [facility] Address: [address], [place], [place] [address] Phone: [phone] (8a-4p) [phone] (8p- 10p) Email: [email or web address] Website: [email or web address] Service(s) Offered: A program for homeless and/or needy men that includes one-on-one counseling, life skills training and job rehabilitation.  Agency Name: [facility] [employer] Address: [address], [place], [place] [address] Phone: [phone] Website: www.alliedchurches.org Service(s) Offered: Assistance to needy in emergency with utility bills, heating fuel, and prescriptions. Shelter for homeless 7pm-7am. February 21, 2017 15  Agency Name: [facility] [place] (Developmentally Disabled) Address: [address], [place], [place] [address] Phone: [phone] Contact Person: Cathleen Corti Email: [email or web address] Website: [email or web address] Service(s) Offered: Helps individuals with developmental disabilities move from housing that is more restrictive to homes where they  can achieve greater independence and have more  opportunities.  Agency Name: [facility] Address: [address],  [place], [place] [address] Phone: [phone] Email: [email or web address] Website: www.[email or web address].org Service(s) Offered: Provides affordable housing for low-income families, elderly, and disabled individuals. Offer a wide range of  programs and services, from financial planning to afterschool and summer programs.  Agency Name: [facility] Address: [address] [place], [place] [address] Phone: [phone] Service(s) Offered: Child support services; child welfare services; food stamps; Medicaid; work first family assistance; and aid with fuel,  rent, food and medicine.  Agency Name: [facility] [place] [employer]. Address: [facility] [address] [place], [place]  [address] Phone: [phone] Website: www.familyabuseservices.org Service(s) Offered: 24 hour Crisis Line: [phone]; 24 hour Emergency Shelter; Transitional Housing; Support Groups; Scientist, physiological; [employer]; Hispanic Outreach: [phone];  Visitation Center: [phone].  Agency Name: [facility] [place]. Address: [address], [place], [place] [address] Phone: [phone] Service(s) Offered: CAP Services; Home and [employer]; Individual or Group Supports; Respite Care Non-Institutional Nursing;  Residential Supports; Respite Care and Personal Care Services; Transportation; Family and Friends Night; Recreational Activities; Three Nutritious Meals/Snacks; Consultation with Registered Dietician; Twenty-four hour Registered Nurse Access; Daily and [employer]; Camp Green Leaves; [place] for the [employer] (During Summer Months) Bingo Night (Every  Wednesday Night); Special Populations Dance Night  (Every Tuesday Night); Professional Hair Care Services.  Agency Name: [facility] Address: [address], [place], [place] [address] Phone: [phone] Contact Person: Jabier Mutton Website:  [email or web address].Physicist, medical) Offered: Residential treatment facility for women; food and  clothing, educational & employment development and  transportation to work; Counsellor of financial skills;  parenting and family reunification; emotional and spiritual  support; transitional housing for program graduates.  Agency Name: [facility] Address: [address], [place], [place] [address] Phone: [phone] Email: [email or web address] Website: [email or web address].es Service(s) Offered: Public housing units for elderly, disabled, and low income people; housing choice vouchers for income eligible  applicants; shelter plus care vouchers; and Psychologist, clinical.  Agency Name: [facility] [employer] Address: [address], [place], [place] [address] Phone: [phone] Email: [email or web address] Website:  www.habitatalamance.org Service(s) Offered: Build houses for families in need of decent housing. Each adult in the family must invest 200 hours of labor on  someone else's house, work with volunteers to build their own house, attend classes on budgeting, home maintenance, yard care, and attend homeowner association meetings.  Agency Name: [facility] Address: [address], [place], [place] [address] Phone: [phone] Website: www.rsli.org Service(s) Offered: Intermediate care facilities for intellectually delayed, Supervised Living in group homes for adults with developmental disabilities, Supervised Living for people who have dual diagnoses (MRMI), Independent Living, Supported Living, respite and a variety of CAP services, pre-vocational services, day supports, and [employer].  Agency Name: [place] [employer] Phone: [phone] Website: www.NCForeclosurePrevention.gov Service(s) Offered: Zero-interest, deferred loans to homeowners struggling to pay their mortgage. Call  for more information.   Food Resources  Agency Name: [facility] Agency Address: [address] [place], [place] [address] Phone: [phone] Website: www.alamanceservices.org Service(s) Offered: Housing services, self-sufficiency, congregate meal program, weatherization program, Event organiser program, emergency food assistance,  housing counseling, home ownership program, wheels - to work program.  Dole Food free for 60 and older at various locations from [employer], Monday-Friday:  [employer], [address] [place], [phone] -[facility], [address], [place] [phone]  -[facility], [address] [place] [phone]  -[address], [address], [place], [phone]  Agency Name: [facility] Address: [phone] [address], [place], [place] [address] Phone: [phone] Website: www.alamancemow.org Service(s) Offered: Home delivered hot, frozen, and emergency  meals. Grocery assistance program which matches  volunteers one-on-one with seniors unable to grocery shop  for themselves. Must be 60 years and older; less than 20  hours of in-home aide service, limited or no driving ability;  live alone or with someone with a disability; live in  [place].  Agency Name: [facility]) Address: [address], [place], [place] [address] Phone: [phone] Service(s) Offered: Food is served to shut-ins, homeless, elderly, and low income people in the community every Saturday (11:30 am-12:30 pm) and Sunday (12:30 pm-1:30pm). Volunteers also offer help and encouragement in seeking employment,  and spiritual guidance.  Agency Name: [facility] Services Address: [address] [place], [place] [address] Phone: [phone] Service(s) Offered: Child support services; child welfare services; food stamps; Medicaid; work first  family assistance; and aid with fuel,  rent, food and medicine.  Agency Name: [facility] Address: [address], [place], [place] Phone: [phone] Website: www.[email or web address] Services Offered: Monday 10:00am-12:00, 8:00pm-9:00pm, and Friday 10:00am-12:00.  Agency Name: [facility] Address: [address] [place], [place] [address] Phone: [phone] Website: www.alliedchurches.org Service(s) Offered: Serves weekday meals, open from 11:30 am- 1:00 pm., and 6:30-7:30pm, Monday-Wednesday-Friday distributes food 3:30-6pm, Monday-Wednesday-Friday.  Agency Name: [facility] Address: [address], [place], [place] Phone: [phone] Website: [email or web address].[email or web address].[email or web address] Services Offered: Distributes food the 4th Saturday of the month, starting at 8:00 am  Agency Name: [facility] Address: [phone] [address], [place], [place] [address] Phone: [phone] Website: [email or web address] Service(s) Offered: Bread of life, weekly food pantry. Open Wednesdays from 10:00am-noon.  Agency Name: [facility] Address: [address] [place], [place], [place] Phone: [phone] Services Offered: Distributes food 9am-1pm, Monday-Thursday. Call for details.  Agency Name: [facility] Address: [address] [place], [place] [address] Phone: [phone] Website: firstbaptistburlington.[email or web address] Service(s) Offered: Games developer. Call for  assistance.  Agency Name: [facility] Address: [address] [place], [place] [address] Phone: [phone] Service Offered: Emergency Food Pantry. Call for appointment.  Agency Name: [facility] Address: [address] [place], [place] [address] Phone: [phone] Website: [email or web address] Services Offered: Games developer. Call for details  Agency Name: [facility] Address: [address]  [place], [place] Phone: [phone] Website: [email or web address] Service(s) Offered: Emergency Food Pantry. Call for details.  Agency Name: Holiday representative Address: [address] [place], [place] [address] Phone: [phone] or [phone] Website: [email or web address] Service(s) Offered: Distribute food 9am-11:30 am, Tuesday-Friday, and 1-3:30pm, Monday-Friday. Food pantry Monday-Friday 1pm-3pm, fresh items, Mon.-Wed.-Fri.  Agency Name: [facility] Address: [address] [place], [place] [address] Phone: [phone] Website: www.safealamance.org Services Offered: Distribute food Tues and Sats from 9:00am-noon. Closed 1st Saturday of each month. Call for details   Transportation Resources  Agency Name: [facility] Agency Address: [address] [place], [place] [address] Phone: [phone] Email: [email or web address] Website: www.[email or web address] Service(s) Offered: Housing services, self-sufficiency, congregate meal program, weatherization program, Field seismologist program, emergency food assistance,  housing counseling, home ownership program, wheels-towork program.  Agency Name: [facility] [employer] [id number]) Address: [address], [place], [place] [address] Phone: [phone] Website: [email or web address].[email or web address] Service(s) Offered: Transportation for [employer], subscription and demand response; Dial-a-Ride for citizens 42 years of age or older.  Agency Name: Department of Social Services Address: [address] [place], [place] [address] Phone: [phone] Service(s) Offered: Child support services; child welfare services; food stamps; Medicaid; work first family assistance; and aid with fuel,  rent, food and medicine, transportation assistance.  Agency Name: Disabled [employer] (DAV) Transportation  Network Phone: [phone] Service(s)  Offered: Transports veterans to the [facility] medical center. Call  forty-eight hours in advance and leave the name, telephone  number, date, and time of appointment. Veteran will be  contacted by the driver the day before the appointment to  arrange a pick up point

## 2023-07-07 NOTE — Discharge Summary (Signed)
Physician Discharge Summary  Pamela Hanson:[id number] DOB: [birth year] DOA: 07/05/2023  PCP: Pcp, No  Admit date: 07/05/2023 Discharge date: 07/07/2023  Admitted From: Home Disposition:  Home  Recommendations for Outpatient Follow-up:  Follow up with PCP in 1-2 weeks   Home Health:No Equipment/Devices:None   Discharge Condition:Stable  CODE STATUS:FULL  Diet recommendation: Reg  Brief/Interim Summary:   67 y.o. female with medical history significant of essential hypertension, degenerative disc disease, prior history of HCTZ induced pancreatitis, COPD, who presented to the ER complaining of worsening lower extremity edema and some shortness of breath.  Patient apparently has not been taking her blood pressure medicines for almost 3 months.  She ran out of the medications.  She has Medicaid and a new physician was assigned to her but she was unable to get to the physician on time.  When she finally did she could not locate the place.  She is therefore not been on medications.  She has been on amlodipine.  Prior to that she was on hydrochlorothiazide that she believes caused her pancreatitis so it was stopped.  She was given some other diuretic but then taken off of that.  In the ER today patient was found to have bilateral lower extremity 2+ pedal edema.  No evidence of fluid overload in the lungs.  No prior history of CHF.  Patient's blood pressure was also markedly elevated with systolic around 200 diastolic over 100.  She is therefore being admitted for observation and to sort out patient's home medications.   9/8: Blood pressure control improved.  Lengthy discussion with patient at time of discharge.  Patient has a number of medication intolerances and medication she is not willing to take due to the thought that may be exacerbating underlying pancreatitis.  At the end of my discussion decision made to discharge on amlodipine 10 mg daily and lisinopril 20 mg daily.  Explained that this  may not provide sufficient control and that she needs to establish care with a PCP to determine further antihypertensive strategy.  Fortunately her echocardiogram is reassuring with normal ejection fraction no diastolic dysfunction and no valvulopathy.  As such we will not recommend loop diuretics at this time.  Offered thiazide diuretics however patient refused stating risk of pancreatitis.  The etiology of lower extremity edema is unclear.  Possibly chronic appearing.  Possibly related to amlodipine though she has not taken in many months.    Discharge Diagnoses:  Principal Problem:   Edema Active Problems:   Essential hypertension   Class 1 obesity due to excess calories with serious comorbidity and body mass index (BMI) of 33.0 to 33.9 in adult   Chronic bilateral low back pain without sciatica   Hyperlipidemia   Pure hypercholesterolemia   Gastroesophageal reflux disease without esophagitis   Hypertensive urgency  Hypertensive urgency Blood pressure control improved.  Echocardiogram reassuring.  At time of discharge will recommend lisinopril 20 mg daily, amlodipine 10 mg daily.  Patient will need to establish care with a primary care physician for further antihypertensive strategy.  She agrees to do so  Discharge Instructions  Discharge Instructions     Diet - low sodium heart healthy   Complete by: As directed    Increase activity slowly   Complete by: As directed       Allergies as of 07/07/2023       Reactions   Morphine And Codeine    " I just don't want that in my body"  Medication List     STOP taking these medications    benzonatate 100 MG capsule Commonly known as: Tessalon Perles   meloxicam 15 MG tablet Commonly known as: MOBIC   methylPREDNISolone 4 MG Tbpk tablet Commonly known as: MEDROL DOSEPAK   omeprazole 20 MG capsule Commonly known as: PRILOSEC   potassium chloride 10 MEQ tablet Commonly known as: KLOR-CON       TAKE these  medications    albuterol 108 (90 Base) MCG/ACT inhaler Commonly known as: VENTOLIN HFA Inhale 2 puffs into the lungs every 6 (six) hours as needed for shortness of breath.   amLODipine 10 MG tablet Commonly known as: NORVASC Take 1 tablet (10 mg total) by mouth daily.   cyclobenzaprine 10 MG tablet Commonly known as: FLEXERIL Take 1 tablet (10 mg total) by mouth 3 (three) times daily.   etodolac 500 MG tablet Commonly known as: LODINE Take 1 tablet (500 mg total) by mouth 2 (two) times daily as needed (pain).   lisinopril 20 MG tablet Commonly known as: ZESTRIL Take 1 tablet (20 mg total) by mouth daily. Start taking on: July 08, 2023        Allergies  Allergen Reactions   Morphine And Codeine     " I just don't want that in my body"    Consultations: None   Procedures/Studies: ECHOCARDIOGRAM COMPLETE  Result Date: 07/06/2023    ECHOCARDIOGRAM REPORT   Patient Name:   Pamela Hanson Date of Exam: 07/06/2023 Medical Rec #:  [id number]         Height:       59.0 in Accession #:    [id number]        Weight:       175.6 lb Date of Birth:  01-15-1956         BSA:          1.745 m Patient Age:    67 years          BP:           156/70 mmHg Patient Gender: F                 HR:           63 bpm. Exam Location:  [facility] Procedure: 2D Echo, Cardiac Doppler and Color Doppler Indications:     CHF  History:         Patient has no prior history of Echocardiogram examinations.                  Risk Factors:Hypertension and Dyslipidemia.  Sonographer:     Trellis Moment RDCS (AE, PE) Referring Phys:  [id number] Tresa Moore Diagnosing Phys: Julien Nordmann MD IMPRESSIONS  1. Left ventricular ejection fraction, by estimation, is 60 to 65%. The left ventricle has normal function. The left ventricle has no regional wall motion abnormalities. Left ventricular diastolic parameters are consistent with Grade I diastolic dysfunction (impaired relaxation).  2. Right ventricular systolic function  is normal. The right ventricular size is normal. There is normal pulmonary artery systolic pressure. The estimated right ventricular systolic pressure is 25.8 mmHg.  3. The mitral valve is normal in structure. Mild mitral valve regurgitation. No evidence of mitral stenosis.  4. Tricuspid valve regurgitation is mild to moderate.  5. The aortic valve has an indeterminant number of cusps. Aortic valve regurgitation is not visualized. Aortic valve sclerosis is present, with no evidence of aortic valve stenosis.  6. The inferior vena  cava is normal in size with greater than 50% respiratory variability, suggesting right atrial pressure of 3 mmHg. FINDINGS  Left Ventricle: Left ventricular ejection fraction, by estimation, is 60 to 65%. The left ventricle has normal function. The left ventricle has no regional wall motion abnormalities. The left ventricular internal cavity size was normal in size. There is  no left ventricular hypertrophy. Left ventricular diastolic parameters are consistent with Grade I diastolic dysfunction (impaired relaxation). Right Ventricle: The right ventricular size is normal. No increase in right ventricular wall thickness. Right ventricular systolic function is normal. There is normal pulmonary artery systolic pressure. The tricuspid regurgitant velocity is 2.28 m/s, and  with an assumed right atrial pressure of 5 mmHg, the estimated right ventricular systolic pressure is 25.8 mmHg. Left Atrium: Left atrial size was normal in size. Right Atrium: Right atrial size was normal in size. Pericardium: There is no evidence of pericardial effusion. Mitral Valve: The mitral valve is normal in structure. There is mild calcification of the mitral valve leaflet(s). Mild mitral valve regurgitation. No evidence of mitral valve stenosis. Tricuspid Valve: The tricuspid valve is normal in structure. Tricuspid valve regurgitation is mild to moderate. No evidence of tricuspid stenosis. Aortic Valve: The aortic  valve has an indeterminant number of cusps. Aortic valve regurgitation is not visualized. Aortic valve sclerosis is present, with no evidence of aortic valve stenosis. Pulmonic Valve: The pulmonic valve was normal in structure. Pulmonic valve regurgitation is not visualized. No evidence of pulmonic stenosis. Aorta: The aortic root is normal in size and structure. Venous: The inferior vena cava is normal in size with greater than 50% respiratory variability, suggesting right atrial pressure of 3 mmHg. IAS/Shunts: No atrial level shunt detected by color flow Doppler.  LEFT VENTRICLE PLAX 2D LVIDd:         5.10 cm     Diastology LVIDs:         3.20 cm     LV e' medial:    6.85 cm/s LV PW:         1.00 cm     LV E/e' medial:  18.1 LV IVS:        0.80 cm     LV e' lateral:   7.18 cm/s LVOT diam:     2.00 cm     LV E/e' lateral: 17.3 LV SV:         96 LV SV Index:   55 LVOT Area:     3.14 cm  LV Volumes (MOD) LV vol d, MOD A2C: 71.9 ml LV vol d, MOD A4C: 56.9 ml LV vol s, MOD A2C: 32.0 ml LV vol s, MOD A4C: 28.2 ml LV SV MOD A2C:     39.9 ml LV SV MOD A4C:     56.9 ml LV SV MOD BP:      34.9 ml RIGHT VENTRICLE RV S prime:     12.80 cm/s TAPSE (M-mode): 2.1 cm LEFT ATRIUM             Index        RIGHT ATRIUM           Index LA diam:        4.00 cm 2.29 cm/m   RA Area:     16.40 cm LA Vol (A2C):   53.1 ml 30.43 ml/m  RA Volume:   39.70 ml  22.75 ml/m LA Vol (A4C):   63.9 ml 36.62 ml/m LA Biplane Vol: 59.7 ml 34.21 ml/m  AORTIC  VALVE LVOT Vmax:   148.00 cm/s LVOT Vmean:  103.000 cm/s LVOT VTI:    0.306 m  AORTA Ao Root diam: 2.90 cm MITRAL VALVE                TRICUSPID VALVE MV Area (PHT): 3.89 cm     TR Peak grad:   20.8 mmHg MV Decel Time: 195 msec     TR Vmax:        228.00 cm/s MV E velocity: 124.00 cm/s MV A velocity: 118.00 cm/s  SHUNTS MV E/A ratio:  1.05         Systemic VTI:  0.31 m MV A Prime:    6.3 cm/s     Systemic Diam: 2.00 cm Julien Nordmann MD Electronically signed by Julien Nordmann MD Signature  Date/Time: 07/06/2023/5:09:53 PM    Final    DG Chest 2 View  Result Date: 07/05/2023 CLINICAL DATA:  Shortness of breath EXAM: CHEST - 2 VIEW COMPARISON:  11/28/2022 and older FINDINGS: Enlarged cardiopericardial silhouette with calcified tortuous aorta. Hiatal hernia. Mild linear opacities along the midlung zones bilaterally. Likely scar or atelectasis. No consolidation, pneumothorax or effusion. No edema. Degenerative changes along the spine. Surgical clips in the upper abdomen. IMPRESSION: Borderline cardiopericardial silhouette. Midlung atelectasis or scar. Electronically Signed   By: Karen Kays M.D.   On: 07/05/2023 18:18      Subjective: Seen and examined on the day of discharge.  Stable no distress.  Appropriate for discharge home.  Discharge Exam: Vitals:   07/07/23 0806 07/07/23 0850  BP:  100/84  Pulse:  63  Resp:  20  Temp:  98.1 F (36.7 C)  SpO2: 95% 97%   Vitals:   07/07/23 0000 07/07/23 0805 07/07/23 0806 07/07/23 0850  BP: 139/86 136/69  100/84  Pulse: (!) 56 87  63  Resp: 16 16  20   Temp: 98.7 F (37.1 C) 98.1 F (36.7 C)  98.1 F (36.7 C)  TempSrc: Oral   Oral  SpO2:  (!) 88% 95% 97%  Weight:      Height:        General: Pt is alert, awake, not in acute distress Cardiovascular: RRR, S1/S2 +, no rubs, no gallops Respiratory: CTA bilaterally, no wheezing, no rhonchi Abdominal: Soft, NT, ND, bowel sounds + Extremities: no edema, no cyanosis    The results of significant diagnostics from this hospitalization (including imaging, microbiology, ancillary and laboratory) are listed below for reference.     Microbiology: No results found for this or any previous visit (from the past 240 hour(s)).   Labs: BNP (last 3 results) Recent Labs    11/28/22 1756 07/05/23 1536  BNP 81.9 142.7*   Basic Metabolic Panel: Recent Labs  Lab 07/05/23 1534 07/06/23 0551  NA 140 143  K 4.2 3.2*  CL 101 104  CO2 26 29  GLUCOSE 105* 98  BUN 11 9  CREATININE  0.82 0.77  CALCIUM 9.5 8.5*   Liver Function Tests: Recent Labs  Lab 07/05/23 1534  AST 28  ALT 30  ALKPHOS 91  BILITOT 0.8  PROT 7.6  ALBUMIN 4.4   No results for input(s): "LIPASE", "AMYLASE" in the last 168 hours. No results for input(s): "AMMONIA" in the last 168 hours. CBC: Recent Labs  Lab 07/05/23 1534 07/06/23 0551  WBC 9.0 7.1  HGB 12.1 10.8*  HCT 37.3 33.0*  MCV 91.6 91.4  PLT 396 332   Cardiac Enzymes: No results for input(s): "CKTOTAL", "CKMB", "CKMBINDEX", "  TROPONINI" in the last 168 hours. BNP: Invalid input(s): "POCBNP" CBG: No results for input(s): "GLUCAP" in the last 168 hours. D-Dimer No results for input(s): "DDIMER" in the last 72 hours. Hgb A1c No results for input(s): "HGBA1C" in the last 72 hours. Lipid Profile No results for input(s): "CHOL", "HDL", "LDLCALC", "TRIG", "CHOLHDL", "LDLDIRECT" in the last 72 hours. Thyroid function studies Recent Labs    07/05/23 1534  TSH 3.830   Anemia work up No results for input(s): "VITAMINB12", "FOLATE", "FERRITIN", "TIBC", "IRON", "RETICCTPCT" in the last 72 hours. Urinalysis    Component Value Date/Time   COLORURINE YELLOW (A) 11/29/2022 0803   APPEARANCEUR CLEAR (A) 11/29/2022 0803   LABSPEC 1.012 11/29/2022 0803   PHURINE 7.0 11/29/2022 0803   GLUCOSEU NEGATIVE 11/29/2022 0803   HGBUR NEGATIVE 11/29/2022 0803   BILIRUBINUR NEGATIVE 11/29/2022 0803   KETONESUR NEGATIVE 11/29/2022 0803   PROTEINUR 30 (A) 11/29/2022 0803   NITRITE NEGATIVE 11/29/2022 0803   LEUKOCYTESUR NEGATIVE 11/29/2022 0803   Sepsis Labs Recent Labs  Lab 07/05/23 1534 07/06/23 0551  WBC 9.0 7.1   Microbiology No results found for this or any previous visit (from the past 240 hour(s)).   Time coordinating discharge: Over 30 minutes  SIGNED:   Tresa Moore, MD  [facility] 07/07/2023, 12:26 PM Pager   If 7PM-7AM, please contact night-coverage

## 2023-07-08 NOTE — Group Note (Deleted)

## 2023-08-02 ENCOUNTER — Other Ambulatory Visit: Payer: Self-pay

## 2023-08-02 ENCOUNTER — Emergency Department
Admission: EM | Admit: 2023-08-02 | Discharge: 2023-08-02 | Disposition: A | Discharge status: Home / Self Care | Payer: Medicare Other | Attending: Emergency Medicine | Admitting: Emergency Medicine

## 2023-08-02 ENCOUNTER — Emergency Department: Payer: Medicare Other

## 2023-08-02 DIAGNOSIS — R600 Localized edema: Secondary | ICD-10-CM | POA: Diagnosis present

## 2023-08-02 DIAGNOSIS — I10 Essential (primary) hypertension: Secondary | ICD-10-CM | POA: Diagnosis not present

## 2023-08-02 DIAGNOSIS — J449 Chronic obstructive pulmonary disease, unspecified: Secondary | ICD-10-CM | POA: Diagnosis not present

## 2023-08-02 DIAGNOSIS — G8929 Other chronic pain: Secondary | ICD-10-CM

## 2023-08-02 LAB — URINALYSIS, ROUTINE W REFLEX MICROSCOPIC
Bilirubin Urine: NEGATIVE
Glucose, UA: NEGATIVE mg/dL
Hgb urine dipstick: NEGATIVE
Ketones, ur: NEGATIVE mg/dL
Leukocytes,Ua: NEGATIVE
Nitrite: NEGATIVE
Protein, ur: NEGATIVE mg/dL
Specific Gravity, Urine: 1.004 — ABNORMAL LOW (ref 1.005–1.030)
pH: 7 (ref 5.0–8.0)

## 2023-08-02 LAB — CBC
HCT: 31.1 % — ABNORMAL LOW (ref 36.0–46.0)
Hemoglobin: 10.3 g/dL — ABNORMAL LOW (ref 12.0–15.0)
MCH: 30.1 pg (ref 26.0–34.0)
MCHC: 33.1 g/dL (ref 30.0–36.0)
MCV: 90.9 fL (ref 80.0–100.0)
Platelets: 314 10*3/uL (ref 150–400)
RBC: 3.42 MIL/uL — ABNORMAL LOW (ref 3.87–5.11)
RDW: 14.4 % (ref 11.5–15.5)
WBC: 8.6 10*3/uL (ref 4.0–10.5)
nRBC: 0 % (ref 0.0–0.2)

## 2023-08-02 LAB — BASIC METABOLIC PANEL
Anion gap: 10 (ref 5–15)
BUN: 20 mg/dL (ref 8–23)
CO2: 24 mmol/L (ref 22–32)
Calcium: 8.6 mg/dL — ABNORMAL LOW (ref 8.9–10.3)
Chloride: 107 mmol/L (ref 98–111)
Creatinine, Ser: 0.86 mg/dL (ref 0.44–1.00)
GFR, Estimated: >60 mL/min (ref >60)
Glucose, Bld: 118 mg/dL — ABNORMAL HIGH (ref 70–99)
Potassium: 3.4 mmol/L — ABNORMAL LOW (ref 3.5–5.1)
Sodium: 141 mmol/L (ref 135–145)

## 2023-08-02 LAB — BRAIN NATRIURETIC PEPTIDE: B Natriuretic Peptide: 161 pg/mL — ABNORMAL HIGH (ref 0.0–100.0)

## 2023-08-02 MED ORDER — POTASSIUM CHLORIDE CRYS ER 20 MEQ PO TBCR
40.0000 meq | EXTENDED_RELEASE_TABLET | Freq: Once | ORAL | Status: AC
  Administered 2023-08-02: 40 meq via ORAL
  Filled 2023-08-02: qty 2

## 2023-08-02 MED ORDER — AMLODIPINE BESYLATE 5 MG PO TABS
10.0000 mg | ORAL_TABLET | Freq: Once | ORAL | Status: AC
  Administered 2023-08-02: 10 mg via ORAL
  Filled 2023-08-02: qty 2

## 2023-08-02 MED ORDER — FUROSEMIDE 40 MG PO TABS
40.0000 mg | ORAL_TABLET | Freq: Once | ORAL | Status: AC
  Administered 2023-08-02: 40 mg via ORAL
  Filled 2023-08-02: qty 1

## 2023-08-02 MED ORDER — CYCLOBENZAPRINE HCL 10 MG PO TABS: 10.0000 mg | ORAL_TABLET | Freq: Three times a day (TID) | ORAL | 0 refills | Status: AC | PRN

## 2023-08-02 MED ORDER — ETODOLAC 500 MG PO TABS: 500.0000 mg | ORAL_TABLET | Freq: Two times a day (BID) | ORAL | 0 refills | Status: AC | PRN

## 2023-08-02 MED ORDER — AMLODIPINE BESYLATE 10 MG PO TABS: 10.0000 mg | ORAL_TABLET | Freq: Every day | ORAL | 2 refills | Status: DC

## 2023-08-02 NOTE — ED Triage Notes (Signed)
Pt comes with c/o leg swelling to both legs. Pt states this started 3 days ago. Pt states no hx of chf  but is concerned about it.

## 2023-08-02 NOTE — Discharge Instructions (Signed)
You were seen in the ER today for your leg swelling.  Your testing fortunately did not show an emergency cause for this.  I sent a prescription for blood pressure medication as well as refills of your muscle relaxer and NSAID to your pharmacy.  It is very important that you follow-up with a primary care doctor for further evaluation.  I included a list of primary care doctors in the county in your paperwork.  Return to the ER for new or worsening symptoms.

## 2023-08-02 NOTE — ED Provider Notes (Signed)
[facility] Provider Note    Event Date/Time   First MD Initiated Contact with Patient 08/02/23 1748     (approximate)   History   Leg Swelling   HPI  Pamela Hanson is a  67 year old female with history of HTN, COPD presenting to the emergency department for evaluation of leg swelling.  Patient reports that after she was discharged to the hospital she was never able to fill her prescriptions so she has not been taking any blood pressure medicine.  Over the last few days she has noticed recurrent swelling of her bilateral lower extremities, not worse on one side.  No fevers or chills.  Has had some ongoing shortness of breath, not specifically worsened.  Does report that her swelling improved when she received Lasix previously.  I did review her discharge summary from 07/07/2023.  At that time she presented to the ER with bilateral leg swelling, shortness of breath, hypertension.  There was concern for acute onset congestive heart failure, but her echocardiogram was reassuring.  She was ordered for an antihypertensive regimen with plans for outpatient follow-up to further evaluate.      Physical Exam   Triage Vital Signs: ED Triage Vitals  Encounter Vitals Group     BP 08/02/23 1507 (!) 195/81     Systolic BP Percentile --      Diastolic BP Percentile --      Pulse Rate 08/02/23 1507 71     Resp 08/02/23 1507 19     Temp 08/02/23 1507 98.6 F (37 C)     Temp src --      SpO2 08/02/23 1507 94 %     Weight --      Height --      Head Circumference --      Peak Flow --      Pain Score 08/02/23 1506 7     Pain Loc --      Pain Education --      Exclude from Growth Chart --     Most recent vital signs: Vitals:   08/02/23 1935 08/02/23 2019  BP: (!) 226/100 (!) 202/95  Pulse: 70   Resp: 17   Temp:    SpO2: 100%      General: Awake, interactive, sitting on chair in no acute distress CV:  Regular rate, good peripheral perfusion.   Resp:  Lungs clear, unlabored respirations.  Abd:  Soft, nondistended, nontender to palpation Neuro:  Symmetric facial movement, fluid speech MSK:  Bilateral lower extremity pitting edema, intact sensation and motor   ED Results / Procedures / Treatments   Labs (all labs ordered are listed, but only abnormal results are displayed) Labs Reviewed  CBC - Abnormal; Notable for the following components:      Result Value   RBC 3.42 (*)    Hemoglobin 10.3 (*)    HCT 31.1 (*)    All other components within normal limits  BASIC METABOLIC PANEL - Abnormal; Notable for the following components:   Potassium 3.4 (*)    Glucose, Bld 118 (*)    Calcium 8.6 (*)    All other components within normal limits  BRAIN NATRIURETIC PEPTIDE - Abnormal; Notable for the following components:   B Natriuretic Peptide 161.0 (*)    All other components within normal limits  URINALYSIS, ROUTINE W REFLEX MICROSCOPIC - Abnormal; Notable for the following components:   Color, Urine COLORLESS (*)    APPearance CLEAR (*)  Specific Gravity, Urine 1.004 (*)    All other components within normal limits     EKG EKG independently reviewed interpreted by myself (ER attending) demonstrates:    RADIOLOGY Imaging independently reviewed and interpreted by myself demonstrates:  CXR demonstrates cardiomegaly with some congestion, likely lung scarring as this appears similar to prior.  Overall without significant change compared to prior on my evaluation.  PROCEDURES:  Critical Care performed: No  Procedures   MEDICATIONS ORDERED IN ED: Medications  furosemide (LASIX) tablet 40 mg (40 mg Oral Given 08/02/23 1940)  amLODipine (NORVASC) tablet 10 mg (10 mg Oral Given 08/02/23 1940)  potassium chloride SA (KLOR-CON M) CR tablet 40 mEq (40 mEq Oral Given 08/02/23 1940)     IMPRESSION / MDM / ASSESSMENT AND PLAN / ED COURSE  I reviewed the triage vital signs and the nursing notes.  Differential diagnosis  includes, but is not limited to, dependent edema as patient reports she is on her feet frequently, lower suspicion for CHF given recent admission with very similar presentation and reassuring echocardiogram, consideration for medication adverse effect though patient has been off meds for a while so less likely, lower suspicion DVT given symmetric edema.  Patient's presentation is most consistent with acute presentation with potential threat to life or bodily function.  67 year old female presenting for recurrent lower extremity swelling after recent admission for similar.  She is hypertensive here, but has not been compliant with her home medications.  No signs of hypertensive emergency. She reports that she is only willing to take certain medications as others could potentially exacerbate pancreatitis so she was ordered for amlodipine which she has previously taken.  Did discuss that it is important for her to follow-up closely as an outpatient for evaluation of her blood pressure as well as further evaluation of her leg swelling.  Her BNP is slightly elevated here but not seemingly changed from a month ago.  I do think there is limited utility for readmission and patient is 100% on room air here.  I did discuss discharge with refills of her amlodipine, Flexeril, and NSAID and close outpatient follow-up.  Patient is comfortable this plan.  Strict return precautions provided.  Patient discharged stable condition.     FINAL CLINICAL IMPRESSION(S) / ED DIAGNOSES   Final diagnoses:  Bilateral lower extremity edema  Uncontrolled hypertension     Rx / DC Orders   ED Discharge Orders          Ordered    etodolac (LODINE) 500 MG tablet  2 times daily PRN        08/02/23 1929    amLODipine (NORVASC) 10 MG tablet  Daily       Note to Pharmacy: 30 day courtesy refill; visit needed for additional refills   08/02/23 1929    cyclobenzaprine (FLEXERIL) 10 MG tablet  3 times daily PRN        08/02/23  1929             Note:  This document was prepared using Dragon voice recognition software and may include unintentional dictation errors.   Trinna Post, MD 08/02/23 2044

## 2023-11-05 ENCOUNTER — Emergency Department
Admission: EM | Admit: 2023-11-05 | Discharge: 2023-11-05 | Disposition: A | Discharge status: Home / Self Care | Payer: Medicare Other | Attending: Emergency Medicine | Admitting: Emergency Medicine

## 2023-11-05 ENCOUNTER — Other Ambulatory Visit: Payer: Self-pay

## 2023-11-05 DIAGNOSIS — R600 Localized edema: Secondary | ICD-10-CM | POA: Insufficient documentation

## 2023-11-05 DIAGNOSIS — M7989 Other specified soft tissue disorders: Secondary | ICD-10-CM | POA: Diagnosis present

## 2023-11-05 DIAGNOSIS — I10 Essential (primary) hypertension: Secondary | ICD-10-CM

## 2023-11-05 LAB — BASIC METABOLIC PANEL
Anion gap: 16 — ABNORMAL HIGH (ref 5–15)
BUN: 13 mg/dL (ref 8–23)
CO2: 22 mmol/L (ref 22–32)
Calcium: 9.1 mg/dL (ref 8.9–10.3)
Chloride: 102 mmol/L (ref 98–111)
Creatinine, Ser: 0.75 mg/dL (ref 0.44–1.00)
GFR, Estimated: >60 mL/min (ref >60)
Glucose, Bld: 84 mg/dL (ref 70–99)
Potassium: 3.6 mmol/L (ref 3.5–5.1)
Sodium: 140 mmol/L (ref 135–145)

## 2023-11-05 LAB — CBC
HCT: 37 % (ref 36.0–46.0)
Hemoglobin: 12 g/dL (ref 12.0–15.0)
MCH: 30.1 pg (ref 26.0–34.0)
MCHC: 32.4 g/dL (ref 30.0–36.0)
MCV: 92.7 fL (ref 80.0–100.0)
Platelets: 311 10*3/uL (ref 150–400)
RBC: 3.99 MIL/uL (ref 3.87–5.11)
RDW: 13.2 % (ref 11.5–15.5)
WBC: 6.9 10*3/uL (ref 4.0–10.5)
nRBC: 0 % (ref 0.0–0.2)

## 2023-11-05 MED ORDER — AMLODIPINE BESYLATE 10 MG PO TABS: 10.0000 mg | ORAL_TABLET | Freq: Every day | ORAL | 2 refills | Status: DC

## 2023-11-05 MED ORDER — CYCLOBENZAPRINE HCL 5 MG PO TABS: 5.0000 mg | ORAL_TABLET | Freq: Three times a day (TID) | ORAL | 0 refills | Status: DC | PRN

## 2023-11-05 MED ORDER — FUROSEMIDE 20 MG PO TABS: 20.0000 mg | ORAL_TABLET | Freq: Every day | ORAL | 11 refills | Status: DC

## 2023-11-05 MED ORDER — ETODOLAC ER 500 MG PO TB24: 500.0000 mg | ORAL_TABLET | Freq: Every day | ORAL | 1 refills | Status: DC

## 2023-11-05 NOTE — ED Triage Notes (Signed)
 Pt to ED for bilateral leg swelling since Thursday. Reports has had this happen before and needed lasix, does not currently take.  Pt is homeless

## 2023-11-05 NOTE — ED Provider Notes (Signed)
 [facility] Provider Note    Event Date/Time   First MD Initiated Contact with Patient 11/05/23 1406     (approximate)   History   Leg Swelling   HPI  Pamela Hanson is a 68 y.o. female who presents with lower extremity swelling and need for medication refill.  Patient reports she used to be on Lasix  for lower extremity edema but ran out quite sometime ago.  She also reports that she is out of her Flexeril , etodolac , amlodipine  and request refills.  She is well-appearing without shortness of breath     Physical Exam   Triage Vital Signs: ED Triage Vitals  Encounter Vitals Group     BP 11/05/23 1119 (!) 153/74     Systolic BP Percentile --      Diastolic BP Percentile --      Pulse Rate 11/05/23 1119 83     Resp 11/05/23 1119 17     Temp 11/05/23 1119 98.4 F (36.9 C)     Temp src --      SpO2 11/05/23 1119 95 %     Weight 11/05/23 1118 81.6 kg (180 lb)     Height 11/05/23 1118 1.524 m (5')     Head Circumference --      Peak Flow --      Pain Score 11/05/23 1118 7     Pain Loc --      Pain Education --      Exclude from Growth Chart --     Most recent vital signs: Vitals:   11/05/23 1119  BP: (!) 153/74  Pulse: 83  Resp: 17  Temp: 98.4 F (36.9 C)  SpO2: 95%     General: Awake, no distress.  CV:  Good peripheral perfusion.  No rales Resp:  Normal effort.  Abd:  No distention.  Other:  1+ edema bilaterally   ED Results / Procedures / Treatments   Labs (all labs ordered are listed, but only abnormal results are displayed) Labs Reviewed  BASIC METABOLIC PANEL - Abnormal; Notable for the following components:      Result Value   Anion gap 16 (*)    All other components within normal limits  CBC     EKG     RADIOLOGY     PROCEDURES:  Critical Care performed:   Procedures   MEDICATIONS ORDERED IN ED: Medications - No data to display   IMPRESSION / MDM / ASSESSMENT AND PLAN / ED COURSE  I reviewed  the triage vital signs and the nursing notes. Patient's presentation is most consistent with exacerbation of chronic illness.  Patient presents with lower extremity edema as detailed above, she does have a history of edema.  She has no shortness of breath, no rales, overall well-appearing.  Will start her on p.o. Lasix , lab work, potassium is reassuring, PCP referral placed, medications refilled        FINAL CLINICAL IMPRESSION(S) / ED DIAGNOSES   Final diagnoses:  Bilateral lower extremity edema     Rx / DC Orders   ED Discharge Orders          Ordered    furosemide  (LASIX ) 20 MG tablet  Daily        11/05/23 1429    amLODipine  (NORVASC ) 10 MG tablet  Daily       Note to Pharmacy: 30 day courtesy refill; visit needed for additional refills   11/05/23 1429    cyclobenzaprine  (FLEXERIL ) 5 MG  tablet  3 times daily PRN        11/05/23 1429    etodolac  (LODINE  XL) 500 MG 24 hr tablet  Daily        11/05/23 1429    Ambulatory Referral to Primary Care (Establish Care)        11/05/23 1429             Note:  This document was prepared using Dragon voice recognition software and may include unintentional dictation errors.   Arlander Charleston, MD 11/05/23 [phone]

## 2024-04-24 ENCOUNTER — Other Ambulatory Visit: Payer: Self-pay | Admitting: Family Medicine

## 2024-04-24 DIAGNOSIS — N6310 Unspecified lump in the right breast, unspecified quadrant: Secondary | ICD-10-CM

## 2024-04-24 DIAGNOSIS — N6320 Unspecified lump in the left breast, unspecified quadrant: Secondary | ICD-10-CM

## 2024-04-24 DIAGNOSIS — N6325 Unspecified lump in the left breast, overlapping quadrants: Secondary | ICD-10-CM

## 2024-04-24 DIAGNOSIS — N6315 Unspecified lump in the right breast, overlapping quadrants: Secondary | ICD-10-CM

## 2024-05-04 ENCOUNTER — Other Ambulatory Visit: Payer: Self-pay

## 2024-05-04 ENCOUNTER — Encounter: Payer: Self-pay | Admitting: Emergency Medicine

## 2024-05-04 ENCOUNTER — Emergency Department

## 2024-05-04 ENCOUNTER — Emergency Department
Admission: EM | Admit: 2024-05-04 | Discharge: 2024-05-04 | Disposition: A | Discharge status: Home / Self Care | Attending: Emergency Medicine | Admitting: Emergency Medicine

## 2024-05-04 DIAGNOSIS — R1012 Left upper quadrant pain: Secondary | ICD-10-CM

## 2024-05-04 DIAGNOSIS — K2900 Acute gastritis without bleeding: Secondary | ICD-10-CM | POA: Insufficient documentation

## 2024-05-04 DIAGNOSIS — E876 Hypokalemia: Secondary | ICD-10-CM | POA: Insufficient documentation

## 2024-05-04 DIAGNOSIS — I10 Essential (primary) hypertension: Secondary | ICD-10-CM | POA: Diagnosis not present

## 2024-05-04 DIAGNOSIS — D72829 Elevated white blood cell count, unspecified: Secondary | ICD-10-CM | POA: Insufficient documentation

## 2024-05-04 LAB — URINALYSIS, ROUTINE W REFLEX MICROSCOPIC
Bilirubin Urine: NEGATIVE
Glucose, UA: NEGATIVE mg/dL
Ketones, ur: NEGATIVE mg/dL
Nitrite: NEGATIVE
Protein, ur: >=300 mg/dL — AB
Specific Gravity, Urine: 1.016 (ref 1.005–1.030)
WBC, UA: >50 WBC/hpf (ref 0–5)
pH: 5 (ref 5.0–8.0)

## 2024-05-04 LAB — CBC
HCT: 46.6 % — ABNORMAL HIGH (ref 36.0–46.0)
Hemoglobin: 15.8 g/dL — ABNORMAL HIGH (ref 12.0–15.0)
MCH: 30.3 pg (ref 26.0–34.0)
MCHC: 33.9 g/dL (ref 30.0–36.0)
MCV: 89.4 fL (ref 80.0–100.0)
Platelets: 467 K/uL — ABNORMAL HIGH (ref 150–400)
RBC: 5.21 MIL/uL — ABNORMAL HIGH (ref 3.87–5.11)
RDW: 13.6 % (ref 11.5–15.5)
WBC: 13.3 K/uL — ABNORMAL HIGH (ref 4.0–10.5)
nRBC: 0 % (ref 0.0–0.2)

## 2024-05-04 LAB — COMPREHENSIVE METABOLIC PANEL WITH GFR
ALT: 15 U/L (ref 0–44)
AST: 27 U/L (ref 15–41)
Albumin: 5 g/dL (ref 3.5–5.0)
Alkaline Phosphatase: 94 U/L (ref 38–126)
Anion gap: 15 (ref 5–15)
BUN: 18 mg/dL (ref 8–23)
CO2: 19 mmol/L — ABNORMAL LOW (ref 22–32)
Calcium: 10.1 mg/dL (ref 8.9–10.3)
Chloride: 105 mmol/L (ref 98–111)
Creatinine, Ser: 1.07 mg/dL — ABNORMAL HIGH (ref 0.44–1.00)
GFR, Estimated: 58 mL/min — ABNORMAL LOW (ref >60)
Glucose, Bld: 129 mg/dL — ABNORMAL HIGH (ref 70–99)
Potassium: 3.2 mmol/L — ABNORMAL LOW (ref 3.5–5.1)
Sodium: 139 mmol/L (ref 135–145)
Total Bilirubin: 1 mg/dL (ref 0.0–1.2)
Total Protein: 8.7 g/dL — ABNORMAL HIGH (ref 6.5–8.1)

## 2024-05-04 LAB — TROPONIN I (HIGH SENSITIVITY): Troponin I (High Sensitivity): 14 ng/L (ref <18)

## 2024-05-04 LAB — LIPASE, BLOOD: Lipase: 31 U/L (ref 11–51)

## 2024-05-04 MED ORDER — SUCRALFATE 1 G PO TABS
1.0000 g | ORAL_TABLET | Freq: Two times a day (BID) | ORAL | 1 refills | Status: DC
  Filled 2024-05-04: qty 120, 60d supply, fill #0

## 2024-05-04 MED ORDER — LACTATED RINGERS IV BOLUS
1000.0000 mL | Freq: Once | INTRAVENOUS | Status: AC
  Administered 2024-05-04: 1000 mL via INTRAVENOUS

## 2024-05-04 MED ORDER — POTASSIUM CHLORIDE CRYS ER 20 MEQ PO TBCR
40.0000 meq | EXTENDED_RELEASE_TABLET | Freq: Once | ORAL | Status: AC
  Administered 2024-05-04: 40 meq via ORAL
  Filled 2024-05-04: qty 2

## 2024-05-04 MED ORDER — IOHEXOL 300 MG/ML  SOLN
100.0000 mL | Freq: Once | INTRAMUSCULAR | Status: AC | PRN
  Administered 2024-05-04: 100 mL via INTRAVENOUS

## 2024-05-04 MED ORDER — ONDANSETRON HCL 4 MG/2ML IJ SOLN
4.0000 mg | Freq: Once | INTRAMUSCULAR | Status: AC
  Administered 2024-05-04: 4 mg via INTRAVENOUS
  Filled 2024-05-04: qty 2

## 2024-05-04 MED ORDER — OXYCODONE-ACETAMINOPHEN 5-325 MG PO TABS
1.0000 | ORAL_TABLET | Freq: Once | ORAL | Status: AC
  Administered 2024-05-04: 1 via ORAL
  Filled 2024-05-04: qty 1

## 2024-05-04 NOTE — ED Provider Notes (Signed)
 [facility] Provider Note    Event Date/Time   First MD Initiated Contact with Patient 05/04/24 1545     (approximate)   History   Chief Complaint Abnormal Lab   HPI  Pamela Hanson is a 68 y.o. female with past medical history of hypertension, hyperlipidemia, and GERD who presents to the ED complaining of abnormal labs.  Patient reports that she has been dealing with increasing pain across her abdomen, especially in her epigastrium, for the past week.  This has been associated with nausea, dry heaves, and diarrhea.  She denies any fevers, dysuria, or flank pain.  She was seen by her PCP for this problem, was told that the enzyme checking on my pancreas was abnormal and that she needed to be seen in the ED.  She reports a history of similar symptoms associated with pancreatitis, admits occasional alcohol consumption but is not a regular drinker.     Physical Exam   Triage Vital Signs: ED Triage Vitals  Encounter Vitals Group     BP 05/04/24 1429 (!) 133/97     Girls Systolic BP Percentile --      Girls Diastolic BP Percentile --      Boys Systolic BP Percentile --      Boys Diastolic BP Percentile --      Pulse Rate 05/04/24 1429 (!) 108     Resp 05/04/24 1429 17     Temp 05/04/24 1429 98.9 F (37.2 C)     Temp Source 05/04/24 1429 Oral     SpO2 05/04/24 1429 97 %     Weight 05/04/24 1427 168 lb (76.2 kg)     Height 05/04/24 1427 5' (1.524 m)     Head Circumference --      Peak Flow --      Pain Score 05/04/24 1427 7     Pain Loc --      Pain Education --      Exclude from Growth Chart --     Most recent vital signs: Vitals:   05/04/24 1800 05/04/24 2006  BP: (!) 140/86 (!) 148/96  Pulse: 99 95  Resp: 16 18  Temp:  98.4 F (36.9 C)  SpO2: 96% 98%    Constitutional: Alert and oriented. Eyes: Conjunctivae are normal. Head: Atraumatic. Nose: No congestion/rhinnorhea. Mouth/Throat: Mucous membranes are moist.  Cardiovascular:  Normal rate, regular rhythm. Grossly normal heart sounds.  2+ radial pulses bilaterally. Respiratory: Normal respiratory effort.  No retractions. Lungs CTAB. Gastrointestinal: Soft and diffusely tender to palpation, greatest in the epigastrium. No distention. Musculoskeletal: No lower extremity tenderness nor edema.  Neurologic:  Normal speech and language. No gross focal neurologic deficits are appreciated.    ED Results / Procedures / Treatments   Labs (all labs ordered are listed, but only abnormal results are displayed) Labs Reviewed  COMPREHENSIVE METABOLIC PANEL WITH GFR - Abnormal; Notable for the following components:      Result Value   Potassium 3.2 (*)    CO2 19 (*)    Glucose, Bld 129 (*)    Creatinine, Ser 1.07 (*)    Total Protein 8.7 (*)    GFR, Estimated 58 (*)    All other components within normal limits  CBC - Abnormal; Notable for the following components:   WBC 13.3 (*)    RBC 5.21 (*)    Hemoglobin 15.8 (*)    HCT 46.6 (*)    Platelets 467 (*)    All  other components within normal limits  URINALYSIS, ROUTINE W REFLEX MICROSCOPIC - Abnormal; Notable for the following components:   Color, Urine AMBER (*)    APPearance CLOUDY (*)    Hgb urine dipstick SMALL (*)    Protein, ur >=300 (*)    Leukocytes,Ua SMALL (*)    Bacteria, UA MANY (*)    All other components within normal limits  LIPASE, BLOOD  TROPONIN I (HIGH SENSITIVITY)     EKG  ED ECG REPORT I, Carlin Palin, the attending physician, personally viewed and interpreted this ECG.   Date: 05/04/2024  EKG Time: 16:29  Rate: 90  Rhythm: normal sinus rhythm  Axis: Normal  Intervals:none  ST&T Change: None  RADIOLOGY CT abdomen/pelvis reviewed and interpreted by me with no inflammatory changes, focal fluid collections, or dilated bowel loops.  PROCEDURES:  Critical Care performed: No  Procedures   MEDICATIONS ORDERED IN ED: Medications  ondansetron  (ZOFRAN ) injection 4 mg (4 mg  Intravenous Given 05/04/24 1650)  lactated ringers  bolus 1,000 mL (0 mLs Intravenous Stopped 05/04/24 2005)  oxyCODONE -acetaminophen  (PERCOCET/ROXICET) 5-325 MG per tablet 1 tablet (1 tablet Oral Given 05/04/24 1651)  potassium chloride  SA (KLOR-CON  M) CR tablet 40 mEq (40 mEq Oral Given 05/04/24 1720)  iohexol  (OMNIPAQUE ) 300 MG/ML solution 100 mL (100 mLs Intravenous Contrast Given 05/04/24 1818)     IMPRESSION / MDM / ASSESSMENT AND PLAN / ED COURSE  I reviewed the triage vital signs and the nursing notes.                              68 y.o. female with past medical history of hypertension, hyperlipidemia, and GERD who presents to the ED complaining of abdominal pain with abnormal lab checking on her pancreas last week.  Patient's presentation is most consistent with acute presentation with potential threat to life or bodily function.  Differential diagnosis includes, but is not limited to, pancreatitis, hepatitis, cholecystitis, biliary colic, gastritis, GERD, gastroenteritis, dehydration, electrolyte abnormality, AKI.  Patient nontoxic-appearing and in no acute distress, vital signs remarkable for mild tachycardia but otherwise reassuring.  Her abdomen is soft but diffusely tender to palpation, greatest in the epigastrium.  Labs with mild hypokalemia, AKI, and leukocytosis but no significant anemia.  LFTs and lipase are unremarkable, urinalysis pending at this time.  We will further assess with CT imaging, treat symptomatically with IV Zofran  and dose of Percocet.  Abdominal etiology seems most likely, but will screen EKG and troponin given her upper abdominal pain.  CT imaging is unremarkable, urine sample appears contaminated but no symptoms to suggest UTI.  Troponin within normal limits, doubt cardiac etiology.  Patient's symptoms improved on reassessment and she is appropriate for discharge home with outpatient follow-up.  She already takes a PPI and we will prescribe Carafate , she was counseled  to return to the ED for new or worsening symptoms.  Patient agrees with plan.      FINAL CLINICAL IMPRESSION(S) / ED DIAGNOSES   Final diagnoses:  Left upper quadrant abdominal pain  Acute gastritis without hemorrhage, unspecified gastritis type     Rx / DC Orders   ED Discharge Orders          Ordered    sucralfate  (CARAFATE ) 1 g tablet  2 times daily        05/04/24 2014             Note:  This document was prepared using Dragon  voice recognition software and may include unintentional dictation errors.   Willo Dunnings, MD 05/04/24 2015

## 2024-05-04 NOTE — ED Notes (Signed)
 Pt transported to CT ?

## 2024-05-04 NOTE — ED Triage Notes (Signed)
 Patient to ED via POV for abnormal lab- pt states elevated pancreatic enzymes (unsure of lab value) taken at [facility]. States C/o nausea and more BM than normal (diarrhea).

## 2024-05-05 ENCOUNTER — Other Ambulatory Visit: Payer: Self-pay

## 2024-05-18 ENCOUNTER — Other Ambulatory Visit: Payer: Self-pay

## 2024-05-21 ENCOUNTER — Emergency Department
Admission: EM | Admit: 2024-05-21 | Discharge: 2024-05-21 | Disposition: A | Discharge status: Home / Self Care | Attending: Emergency Medicine | Admitting: Emergency Medicine

## 2024-05-21 ENCOUNTER — Other Ambulatory Visit: Payer: Self-pay

## 2024-05-21 ENCOUNTER — Encounter: Payer: Self-pay | Admitting: *Deleted

## 2024-05-21 ENCOUNTER — Emergency Department

## 2024-05-21 DIAGNOSIS — R600 Localized edema: Secondary | ICD-10-CM | POA: Insufficient documentation

## 2024-05-21 DIAGNOSIS — I10 Essential (primary) hypertension: Secondary | ICD-10-CM | POA: Insufficient documentation

## 2024-05-21 DIAGNOSIS — E876 Hypokalemia: Secondary | ICD-10-CM | POA: Insufficient documentation

## 2024-05-21 DIAGNOSIS — M7989 Other specified soft tissue disorders: Secondary | ICD-10-CM | POA: Diagnosis present

## 2024-05-21 LAB — CBC
HCT: 33.4 % — ABNORMAL LOW (ref 36.0–46.0)
Hemoglobin: 11 g/dL — ABNORMAL LOW (ref 12.0–15.0)
MCH: 30.2 pg (ref 26.0–34.0)
MCHC: 32.9 g/dL (ref 30.0–36.0)
MCV: 91.8 fL (ref 80.0–100.0)
Platelets: 351 K/uL (ref 150–400)
RBC: 3.64 MIL/uL — ABNORMAL LOW (ref 3.87–5.11)
RDW: 14.6 % (ref 11.5–15.5)
WBC: 8.6 K/uL (ref 4.0–10.5)
nRBC: 0 % (ref 0.0–0.2)

## 2024-05-21 LAB — BASIC METABOLIC PANEL WITH GFR
Anion gap: 10 (ref 5–15)
BUN: 16 mg/dL (ref 8–23)
CO2: 24 mmol/L (ref 22–32)
Calcium: 9.1 mg/dL (ref 8.9–10.3)
Chloride: 109 mmol/L (ref 98–111)
Creatinine, Ser: 0.72 mg/dL (ref 0.44–1.00)
GFR, Estimated: >60 mL/min (ref >60)
Glucose, Bld: 112 mg/dL — ABNORMAL HIGH (ref 70–99)
Potassium: 3.2 mmol/L — ABNORMAL LOW (ref 3.5–5.1)
Sodium: 143 mmol/L (ref 135–145)

## 2024-05-21 LAB — BRAIN NATRIURETIC PEPTIDE: B Natriuretic Peptide: 68.9 pg/mL (ref 0.0–100.0)

## 2024-05-21 MED ORDER — POTASSIUM CHLORIDE CRYS ER 20 MEQ PO TBCR
40.0000 meq | EXTENDED_RELEASE_TABLET | Freq: Once | ORAL | Status: AC
  Administered 2024-05-21: 40 meq via ORAL
  Filled 2024-05-21: qty 2

## 2024-05-21 MED ORDER — FUROSEMIDE 10 MG/ML IJ SOLN
60.0000 mg | Freq: Once | INTRAMUSCULAR | Status: AC
  Administered 2024-05-21: 60 mg via INTRAVENOUS
  Filled 2024-05-21: qty 8

## 2024-05-21 MED ORDER — ETODOLAC 200 MG PO CAPS
200.0000 mg | ORAL_CAPSULE | Freq: Three times a day (TID) | ORAL | 0 refills | Status: DC
  Filled 2024-05-21: qty 90, 30d supply, fill #0

## 2024-05-21 MED ORDER — POTASSIUM CHLORIDE CRYS ER 20 MEQ PO TBCR
20.0000 meq | EXTENDED_RELEASE_TABLET | Freq: Two times a day (BID) | ORAL | 1 refills | Status: DC
  Filled 2024-05-21: qty 60, 30d supply, fill #0

## 2024-05-21 MED ORDER — AMLODIPINE BESYLATE 10 MG PO TABS
10.0000 mg | ORAL_TABLET | Freq: Every day | ORAL | 2 refills | Status: DC
  Filled 2024-05-21: qty 30, 30d supply, fill #0
  Filled 2024-06-19: qty 30, 30d supply, fill #1
  Filled 2024-07-21: qty 30, 30d supply, fill #2

## 2024-05-21 MED ORDER — CYCLOBENZAPRINE HCL 5 MG PO TABS
5.0000 mg | ORAL_TABLET | Freq: Three times a day (TID) | ORAL | 0 refills | Status: DC | PRN
  Filled 2024-05-21: qty 30, 10d supply, fill #0

## 2024-05-21 MED ORDER — FUROSEMIDE 40 MG PO TABS
40.0000 mg | ORAL_TABLET | Freq: Every day | ORAL | 11 refills | Status: DC
  Filled 2024-05-21: qty 30, 30d supply, fill #0
  Filled 2024-06-19: qty 30, 30d supply, fill #1
  Filled 2024-07-21: qty 30, 30d supply, fill #2

## 2024-05-21 NOTE — ED Provider Notes (Signed)
 [facility] Provider Note    Event Date/Time   First MD Initiated Contact with Patient 05/21/24 1834     (approximate)   History   Leg Swelling   HPI  Pamela Hanson is a 68 y.o. female with a history of hypertension, pancreatitis presents with complaints of lower extremity edema.  She has had this before but ran out of her Lasix .  She denies shortness of breath, no abdominal pain, no chest pain.     Physical Exam   Triage Vital Signs: ED Triage Vitals  Encounter Vitals Group     BP 05/21/24 1616 (!) 163/80     Girls Systolic BP Percentile --      Girls Diastolic BP Percentile --      Boys Systolic BP Percentile --      Boys Diastolic BP Percentile --      Pulse Rate 05/21/24 1616 70     Resp 05/21/24 1616 18     Temp 05/21/24 1616 98.8 F (37.1 C)     Temp Source 05/21/24 1616 Oral     SpO2 05/21/24 1616 95 %     Weight 05/21/24 1613 76.2 kg (168 lb)     Height 05/21/24 1613 1.524 m (5')     Head Circumference --      Peak Flow --      Pain Score 05/21/24 1613 6     Pain Loc --      Pain Education --      Exclude from Growth Chart --     Most recent vital signs: Vitals:   05/21/24 1616 05/21/24 1854  BP: (!) 163/80 (!) 176/84  Pulse: 70 72  Resp: 18   Temp: 98.8 F (37.1 C)   SpO2: 95% 97%     General: Awake, no distress.  CV:  Good peripheral perfusion.  Resp:  Normal effort.  Abd:  No distention.  Soft, nontender Other:  Bilateral lower extremity edema 2+.   ED Results / Procedures / Treatments   Labs (all labs ordered are listed, but only abnormal results are displayed) Labs Reviewed  BASIC METABOLIC PANEL WITH GFR - Abnormal; Notable for the following components:      Result Value   Potassium 3.2 (*)    Glucose, Bld 112 (*)    All other components within normal limits  CBC - Abnormal; Notable for the following components:   RBC 3.64 (*)    Hemoglobin 11.0 (*)    HCT 33.4 (*)    All other components within  normal limits  BRAIN NATRIURETIC PEPTIDE     EKG     RADIOLOGY Chest x-ray viewed interpreted by me, no acute abnormality    PROCEDURES:  Critical Care performed:   Procedures   MEDICATIONS ORDERED IN ED: Medications  furosemide  (LASIX ) injection 60 mg (60 mg Intravenous Given 05/21/24 1857)  potassium chloride  SA (KLOR-CON  M) CR tablet 40 mEq (40 mEq Oral Given 05/21/24 1853)     IMPRESSION / MDM / ASSESSMENT AND PLAN / ED COURSE  I reviewed the triage vital signs and the nursing notes. Patient's presentation is most consistent with exacerbation of chronic illness.  Patient presents with lower extremity edema bilaterally, she has no tenderness in the calves.  Likely related to running out of her Lasix .  Lab work reviewed and is overall reassuring, mild hypokalemia, will give K-Dur, IV Lasix  here, will refill her prescriptions, outpatient follow-up with CHF clinic.  FINAL CLINICAL IMPRESSION(S) / ED DIAGNOSES   Final diagnoses:  Bilateral lower extremity edema     Rx / DC Orders   ED Discharge Orders          Ordered    furosemide  (LASIX ) 40 MG tablet  Daily        05/21/24 1900    potassium chloride  SA (KLOR-CON  M) 20 MEQ tablet  2 times daily        05/21/24 1900    amLODipine  (NORVASC ) 10 MG tablet  Daily       Note to Pharmacy: 30 day courtesy refill; visit needed for additional refills   05/21/24 1900    Ambulatory Referral to Primary Care (Establish Care)        05/21/24 1900             Note:  This document was prepared using Dragon voice recognition software and may include unintentional dictation errors.   Arlander Charleston, MD 05/21/24 RETHA

## 2024-05-21 NOTE — ED Triage Notes (Signed)
 Pt ambulatory to triage.  Pt has swelling to both lower legs.   No sob  no chest pain.  Pt reports coughing up white phlegm .  Pt smokes marijuana.  Pt alert  speech clear

## 2024-05-21 NOTE — ED Provider Triage Note (Signed)
 Emergency Medicine Provider Triage Evaluation Note  Pamela Hanson , a 68 y.o. female  was evaluated in triage.  Pt complains of lateral lower extremity edema.  The patient edema started 3 days ago, patient states having abdominal distention, shortness of breath.  Patient is homeless  Review of Systems  Positive:  Negative:   Physical Exam  BP (!) 163/80 (BP Location: Left Arm)   Pulse 70   Temp 98.8 F (37.1 C) (Oral)   Resp 18   Ht 5' (1.524 m)   Wt 76.2 kg   SpO2 95%   BMI 32.81 kg/m during triage patient was tachycardic Gen:   Awake, no distress   Resp:  Normal effort bilateral basal expiratory wheezing MSK:   Moves extremities without difficulty  Other:  Extremities: Bilateral pitting edema grade 3/3  Medical Decision Making  Medically screening exam initiated at 4:22 PM.  Appropriate orders placed.  Pamela Hanson was informed that the remainder of the evaluation will be completed by another provider, this initial triage assessment does not replace that evaluation, and the importance of remaining in the ED until their evaluation is complete.  Patient with bilateral lower extremity pitting edema grade 3/3, abdominal distention, bilateral basal expiratory wheezing.  Possible heart failure this compensated.  CBC CMP chest x-ray BMP   Janit Kast, PA-C 05/21/24 1624

## 2024-05-22 ENCOUNTER — Other Ambulatory Visit: Payer: Self-pay

## 2024-05-22 MED ORDER — ETODOLAC ER 500 MG PO TB24
500.0000 mg | ORAL_TABLET | Freq: Every day | ORAL | 0 refills | Status: DC
  Filled 2024-05-22 – 2024-07-21 (×3): qty 30, 30d supply, fill #0

## 2024-05-22 MED ORDER — ETODOLAC 500 MG PO TABS
500.0000 mg | ORAL_TABLET | Freq: Two times a day (BID) | ORAL | 1 refills | Status: DC
  Filled 2024-05-22 – 2024-06-19 (×4): qty 60, 30d supply, fill #0

## 2024-05-26 ENCOUNTER — Telehealth: Payer: Self-pay | Admitting: Cardiology

## 2024-05-26 NOTE — Telephone Encounter (Signed)
 Called to confirm/remind patient of their appointment at the [facility] on 05/27/24.   Appointment:   [] Confirmed  [x] Left mess   [] No answer/No voice mail  [] VM Full/unable to leave message  [] Phone not in service  Patient reminded to bring all medications and/or complete list.  Confirmed patient has transportation. Gave directions, instructed to utilize valet parking.

## 2024-05-27 ENCOUNTER — Other Ambulatory Visit: Payer: Self-pay

## 2024-05-27 ENCOUNTER — Encounter: Payer: Self-pay | Admitting: Cardiology

## 2024-05-27 ENCOUNTER — Ambulatory Visit: Attending: Cardiology | Admitting: Cardiology

## 2024-05-27 VITALS — BP 154/76 | HR 76 | Wt 179.8 lb

## 2024-05-27 DIAGNOSIS — Z8719 Personal history of other diseases of the digestive system: Secondary | ICD-10-CM | POA: Diagnosis not present

## 2024-05-27 DIAGNOSIS — I5032 Chronic diastolic (congestive) heart failure: Secondary | ICD-10-CM | POA: Insufficient documentation

## 2024-05-27 DIAGNOSIS — Z62820 Parent-biological child conflict: Secondary | ICD-10-CM | POA: Diagnosis not present

## 2024-05-27 DIAGNOSIS — I429 Cardiomyopathy, unspecified: Secondary | ICD-10-CM | POA: Insufficient documentation

## 2024-05-27 DIAGNOSIS — Z5900 Homelessness unspecified: Secondary | ICD-10-CM | POA: Insufficient documentation

## 2024-05-27 DIAGNOSIS — I110 Hypertensive heart disease with heart failure: Secondary | ICD-10-CM | POA: Diagnosis not present

## 2024-05-27 DIAGNOSIS — Z638 Other specified problems related to primary support group: Secondary | ICD-10-CM | POA: Insufficient documentation

## 2024-05-27 DIAGNOSIS — Z79899 Other long term (current) drug therapy: Secondary | ICD-10-CM | POA: Insufficient documentation

## 2024-05-27 DIAGNOSIS — I10 Essential (primary) hypertension: Secondary | ICD-10-CM | POA: Diagnosis not present

## 2024-05-27 MED ORDER — SPIRONOLACTONE 25 MG PO TABS
25.0000 mg | ORAL_TABLET | Freq: Every day | ORAL | 3 refills | Status: DC
  Filled 2024-05-27 (×2): qty 90, 90d supply, fill #0

## 2024-05-27 NOTE — Patient Instructions (Signed)
 Medication Changes:  START Spironolactone  25mg  (1 tab) daily  FOR 3 DAYS ONLY INCREASE Furosemide  to 80mg  (2 tabs) a day THEN RESUME Furosemide  40mg  (1 tab) daily   Follow-Up in: Please follow up with the [facility] in 3 months with Ellouise Class, FNP.   Thank you for choosing [facility].    At the [facility], you and your health needs are our priority. We have a designated team specialized in the treatment of Heart Failure. This Care Team includes your primary Heart Failure Specialized Cardiologist (physician), Advanced Practice Providers (APPs- Physician Assistants and Nurse Practitioners), and Pharmacist who all work together to provide you with the care you need, when you need it.   You may see any of the following providers on your designated Care Team at your next follow up:  Dr. Toribio Fuel Dr. Ezra Shuck Dr. Ria Commander Dr. Morene Brownie Ellouise Class, FNP Jaun Bash, RPH-CPP  Please be sure to bring in all your medications bottles to every appointment.   Need to Contact Us :  If you have any questions or concerns before your next appointment please send us  a message through [place] or call our office at [phone].    TO LEAVE A MESSAGE FOR THE NURSE SELECT OPTION 2, PLEASE LEAVE A MESSAGE INCLUDING: YOUR NAME DATE OF BIRTH CALL BACK NUMBER REASON FOR CALL**this is important as we prioritize the call backs  YOU WILL RECEIVE A CALL BACK THE SAME DAY AS LONG AS YOU CALL BEFORE 4:00 PM

## 2024-05-31 MED ORDER — POTASSIUM CHLORIDE CRYS ER 20 MEQ PO TBCR
20.0000 meq | EXTENDED_RELEASE_TABLET | Freq: Every day | ORAL | 1 refills | Status: DC
  Filled 2024-05-31 – 2024-07-21 (×2): qty 60, 60d supply, fill #0

## 2024-05-31 NOTE — Progress Notes (Signed)
   ADVANCED HEART FAILURE NEW PATIENT CLINIC NOTE  Referring Physician: Center, [facility]  Primary Care: Center, [facility] Primary Cardiologist:  HPI: Pamela Hanson is a 68 y.o. female with a PMH of swelling, medication induced pancreatitis, hypertension who presents for initial visit for further evaluation and treatment of heart failure/cardiomyopathy.           SUBJECTIVE:  Patient with a cardiac history significant for potentially heart failure with preserved ejection fraction.  She has significant social issues, she is currently homeless as she and her son significant other do not get along.  She reports that she has near constant lower extremity swelling and that she has difficulty with her medications.  Her most consistent care was obtained through one of the free clinics around, but otherwise she has minimal medical assistance.  She has found it difficult being out during the summer, especially with his recent heat.  She does not appear to have a current Child psychotherapist and while she has been at shelters in the past is not currently there.  PMH, current medications, allergies, social history, and family history reviewed in epic.  PHYSICAL EXAM: Vitals:   05/27/24 1518  BP: (!) 154/76  Pulse: 76  SpO2: 98%   GENERAL: No apparent distress PULM:  Normal work of breathing, clear to auscultation bilaterally. Respirations are unlabored.  CARDIAC:  JVP: Mildly elevated         Normal rate with regular rhythm. No murmurs, rubs or gallops.  1+ edema. Warm and well perfused extremities. ABDOMEN: Soft, non-tender, non-distended. NEUROLOGIC: Patient is oriented x3 with no focal or lateralizing neurologic deficits.    ASSESSMENT & PLAN:  Chronic heart failure with preserved EF: Mild symptoms, suspect some of her lower extremity swelling is merely dependent as she spends a lot of time sleeping upright. While she does not need advanced heart failure clinic,  may be able to provide social support for her, will discuss prior to her next visit. PCP referral provided. - Start spironolactone  25mg  daily - Continue lasix  40mg  daily, increase to 80mg  for three days - Labs at next visit - Continue amlodipine  10 mg daily - Decrease Kcl to 20mEq daily - Social work support as able - PCP referral, does not need long term AHF follow up - Can consider dropping CCB in the future as well  Hypertension:  - Management as above - Elevated today  SDOH - Housing is a significant difficulty, discuss options for next visit  Follow up in 3 months with APP, after than can hopefully establish with PCP  Morene Brownie, MD Advanced Heart Failure Mechanical Circulatory Support 05/31/24

## 2024-06-01 ENCOUNTER — Other Ambulatory Visit: Payer: Self-pay

## 2024-06-11 ENCOUNTER — Other Ambulatory Visit: Payer: Self-pay

## 2024-06-19 ENCOUNTER — Other Ambulatory Visit: Payer: Self-pay

## 2024-07-21 ENCOUNTER — Other Ambulatory Visit: Payer: Self-pay | Admitting: Emergency Medicine

## 2024-07-21 ENCOUNTER — Other Ambulatory Visit: Payer: Self-pay

## 2024-07-21 ENCOUNTER — Other Ambulatory Visit (HOSPITAL_COMMUNITY): Payer: Self-pay

## 2024-07-21 DIAGNOSIS — M545 Low back pain, unspecified: Secondary | ICD-10-CM

## 2024-07-22 ENCOUNTER — Other Ambulatory Visit: Payer: Self-pay

## 2024-07-24 ENCOUNTER — Other Ambulatory Visit: Payer: Self-pay

## 2024-08-26 ENCOUNTER — Telehealth: Payer: Self-pay | Admitting: Family

## 2024-08-26 NOTE — Telephone Encounter (Signed)
 Called to confirm/remind patient of their appointment at the [facility] on 08/27/24.   Appointment:   [] Confirmed  [x] Left mess   [] No answer/No voice mail  [] VM Full/unable to leave message  [] Phone not in service  Patient reminded to bring all medications and/or complete list.  Confirmed patient has transportation. Gave directions, instructed to utilize valet parking.

## 2024-08-27 ENCOUNTER — Ambulatory Visit: Attending: Family | Admitting: Family

## 2024-08-27 ENCOUNTER — Other Ambulatory Visit: Payer: Self-pay

## 2024-08-27 VITALS — BP 147/67 | HR 75 | Wt 179.0 lb

## 2024-08-27 DIAGNOSIS — Z91148 Patient's other noncompliance with medication regimen for other reason: Secondary | ICD-10-CM | POA: Insufficient documentation

## 2024-08-27 DIAGNOSIS — Z79899 Other long term (current) drug therapy: Secondary | ICD-10-CM | POA: Diagnosis not present

## 2024-08-27 DIAGNOSIS — I10 Essential (primary) hypertension: Secondary | ICD-10-CM | POA: Diagnosis not present

## 2024-08-27 DIAGNOSIS — I110 Hypertensive heart disease with heart failure: Secondary | ICD-10-CM | POA: Diagnosis not present

## 2024-08-27 DIAGNOSIS — Z139 Encounter for screening, unspecified: Secondary | ICD-10-CM

## 2024-08-27 DIAGNOSIS — Z5900 Homelessness unspecified: Secondary | ICD-10-CM | POA: Insufficient documentation

## 2024-08-27 DIAGNOSIS — M7989 Other specified soft tissue disorders: Secondary | ICD-10-CM | POA: Insufficient documentation

## 2024-08-27 DIAGNOSIS — Z8719 Personal history of other diseases of the digestive system: Secondary | ICD-10-CM | POA: Insufficient documentation

## 2024-08-27 DIAGNOSIS — I5032 Chronic diastolic (congestive) heart failure: Secondary | ICD-10-CM | POA: Diagnosis not present

## 2024-08-27 MED ORDER — CYCLOBENZAPRINE HCL 5 MG PO TABS
5.0000 mg | ORAL_TABLET | Freq: Three times a day (TID) | ORAL | 0 refills | Status: DC | PRN
  Filled 2024-08-27 (×2): qty 90, 30d supply, fill #0

## 2024-08-27 MED ORDER — ETODOLAC 500 MG PO TABS
500.0000 mg | ORAL_TABLET | Freq: Two times a day (BID) | ORAL | 1 refills | Status: DC
  Filled 2024-08-27 – 2024-09-30 (×2): qty 60, 30d supply, fill #0

## 2024-08-27 MED ORDER — POTASSIUM CHLORIDE CRYS ER 20 MEQ PO TBCR
20.0000 meq | EXTENDED_RELEASE_TABLET | Freq: Every day | ORAL | 1 refills | Status: AC
  Filled 2024-08-27 – 2024-09-30 (×2): qty 60, 60d supply, fill #0

## 2024-08-27 MED ORDER — SPIRONOLACTONE 25 MG PO TABS
25.0000 mg | ORAL_TABLET | Freq: Every day | ORAL | 3 refills | Status: AC
  Filled 2024-08-27 (×2): qty 90, 90d supply, fill #0

## 2024-08-27 MED ORDER — FUROSEMIDE 40 MG PO TABS
40.0000 mg | ORAL_TABLET | Freq: Every day | ORAL | 11 refills | Status: AC
  Filled 2024-08-27 (×2): qty 30, 30d supply, fill #0

## 2024-08-27 MED ORDER — AMLODIPINE BESYLATE 10 MG PO TABS
10.0000 mg | ORAL_TABLET | Freq: Every day | ORAL | 2 refills | Status: AC
  Filled 2024-08-27 (×2): qty 30, 30d supply, fill #0
  Filled 2024-09-30: qty 30, 30d supply, fill #1

## 2024-08-27 NOTE — Progress Notes (Unsigned)
   ADVANCED HEART FAILURE NEW PATIENT CLINIC NOTE  Referring Physician: Center, [facility]  Primary Care: Center, [facility] Primary Cardiologist:  Chief Complaint:  HPI:  Pamela Hanson is a 68 y.o. female with a PMH of swelling, medication induced pancreatitis, hypertension.    SUBJECTIVE:  Patient with a cardiac history significant for potentially heart failure with preserved ejection fraction. She has significant social issues, she is currently homeless as she and her son & significant other do not get along. She reports that she has near constant lower extremity swelling and that she has difficulty with her medications.  er most consistent care was obtained through one of the free clinics around, but otherwise she has minimal medical assistance. She does not appear to have a current child psychotherapist and while she has been at shelters in the past is not currently there.  Seen in [facility] 05/27/24 where spironolactone  25mg  daily was started, potassium was decreased to 20meq daily. Lasix  increased to 80mg  X 3 days with reduction back down to 40mg  daily afterwards.   She presents today for a HF follow-up visit with a chief complaint of   PMH, current medications, allergies, social history, and family history reviewed in epic.  PHYSICAL EXAM:  GENERAL: No apparent distress PULM:  Normal work of breathing, clear to auscultation bilaterally. Respirations are unlabored.  CARDIAC:  JVP: Mildly elevated         Normal rate with regular rhythm. No murmurs, rubs or gallops.  1+ edema. Warm and well perfused extremities. ABDOMEN: Soft, non-tender, non-distended. NEUROLOGIC: Patient is oriented x3 with no focal or lateralizing neurologic deficits.      ASSESSMENT & PLAN:  Chronic heart failure with preserved EF: Mild symptoms, suspect some of her lower extremity swelling is merely dependent as she spends a lot of time sleeping upright. While she does not need  advanced heart failure clinic, may be able to provide social support for her, will discuss prior to her next visit. PCP referral provided. - Continue spironolactone  25mg  daily - Continue lasix  40mg  daily - Continue amlodipine  10 mg daily - Continue Kcl 20mEq daily - Social work support as able - PCP referral, does not need long term AHF follow up - Can consider dropping CCB in the future as well - BNP 05/21/24 was 68.9  Hypertension:  - BP - BMET 05/21/24 reviewed: sodium 143, potassium 3.2, creatinine 0.72, GFR >60  SDOH - Housing is a significant difficulty, discuss options for next visit    Ellouise Class, FNP-C 08/27/24
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
## 2024-08-27 NOTE — Patient Instructions (Addendum)
 It was a pleasure meeting you today!  Medication Changes:  Medication refilled to [facility] pharmacy  Lab Work:  Go over to the [facility]. Go pass the gift shop and have your blood work completed.  We will only call you if the results are abnormal or if the provider would like to make medication changes.  No news is good news.   Follow-Up in: Please follow up with the [facility] in 1 month with Pamela Class, FNP.   Thank you for choosing [facility].    At the [facility], you and your health needs are our priority. We have a designated team specialized in the treatment of Heart Failure. This Care Team includes your primary Heart Failure Specialized Cardiologist (physician), Advanced Practice Providers (APPs- Physician Assistants and Nurse Practitioners), and Pharmacist who all work together to provide you with the care you need, when you need it.   You may see any of the following providers on your designated Care Team at your next follow up:  Dr. Toribio Fuel Dr. Ezra Shuck Dr. Ria Commander Dr. Morene Brownie Pamela Class, FNP Jaun Bash, RPH-CPP  Please be sure to bring in all your medications bottles to every appointment.   Need to Contact Us :  If you have any questions or concerns before your next appointment please send us  a message through [place] or call our office at [phone].    TO LEAVE A MESSAGE FOR THE NURSE SELECT OPTION 2, PLEASE LEAVE A MESSAGE INCLUDING: YOUR NAME DATE OF BIRTH CALL BACK NUMBER REASON FOR CALL**this is important as we prioritize the call backs  YOU WILL RECEIVE A CALL BACK THE SAME DAY AS LONG AS YOU CALL BEFORE 4:00 PM

## 2024-08-28 ENCOUNTER — Encounter: Payer: Self-pay | Admitting: Family

## 2024-09-09 ENCOUNTER — Telehealth (HOSPITAL_COMMUNITY): Payer: Self-pay | Admitting: Licensed Clinical Social Worker

## 2024-09-09 NOTE — Telephone Encounter (Signed)
CSW attempted to call pt to check in- unable to reach- left VM requesting return call  Cillian Gwinner H. Alexiah Koroma, LCSW Clinical Social Worker [facility] Desk#: [phone] Cell#: [phone]  

## 2024-09-16 ENCOUNTER — Other Ambulatory Visit: Payer: Self-pay

## 2024-09-23 ENCOUNTER — Telehealth: Payer: Self-pay | Admitting: Family

## 2024-09-23 NOTE — Telephone Encounter (Signed)
 Called to confirm/remind patient of their appointment at the [facility] on 09/28/24.   Appointment:   [] Confirmed  [] Left mess   [] No answer/No voice mail  [x] VM Full/unable to leave message  [] Phone not in service  Patient reminded to bring all medications and/or complete list.  Confirmed patient has transportation. Gave directions, instructed to utilize valet parking.

## 2024-09-27 NOTE — Progress Notes (Deleted)
   ADVANCED HEART FAILURE CLINIC NOTE   Referring Physician: [facility]  Primary Care: [facility], [facility] Primary Cardiologist:  Chief Complaint: shortness of breath   HPI:  Pamela Hanson is a 68 y.o. female with a PMH of swelling, medication induced pancreatitis, hypertension.    SUBJECTIVE:  Patient with a cardiac history significant for potentially heart failure with preserved ejection fraction. She has significant social issues, she is currently homeless as she and her son & significant other do not get along. She reports that she has near constant lower extremity swelling and that she has difficulty with her medications.  er most consistent care was obtained through one of the free clinics around, but otherwise she has minimal medical assistance. She does not appear to have a current child psychotherapist and while she has been at shelters in the past is not currently there.  Seen in [facility] [address] 05/27/24 where spironolactone  25mg  daily was started, potassium was decreased to 20meq daily. Lasix  increased to 80mg  X 3 days with reduction back down to 40mg  daily afterwards.   She presents today for a HF follow-up visit with a chief complaint of shortness of breath. Has associated dizziness and pedal edema along with this. Has not taken her BP meds in the last 5 days because she hasn't been able to walk to pharmacy due to the rain.   Remains homeless and has all her belongings in a wagon with her. She says that she has to walk everywhere unless her son is able to pick her up and bring her. She would like to see a PCP somewhere around here so that she can get to the appointment.   PMH, current medications, allergies, social history, and family history reviewed in epic.  PHYSICAL EXAM:  General: Well appearing.  Cor: No JVD. Regular rhythm, rate.  Lungs: clear Abdomen: soft, nontender, nondistended. Extremities: 1+ bilateral pitting edema Neuro:. Affect  pleasant   There were no vitals filed for this visit.  Wt Readings from Last 3 Encounters:  08/27/24 179 lb (81.2 kg)  05/27/24 179 lb 12.8 oz (81.6 kg)  05/21/24 168 lb (76.2 kg)   Lab Results  Component Value Date   CREATININE 0.72 05/21/2024   CREATININE 1.07 (H) 05/04/2024   CREATININE 0.75 11/05/2023    ASSESSMENT & PLAN:  Chronic heart failure with preserved EF: Mild symptoms, suspect some of her lower extremity swelling is merely dependent as she spends a lot of time sleeping upright. While she does not need advanced heart failure clinic, may be able to provide social support for her. PCP referral made today. - Continue lasix  40mg  daily - Continue Kcl 20mEq daily - Continue spironolactone  25mg  daily - BNP 05/21/24 was 68.9  Hypertension:  - BP 147/67 (hasn't taken any meds X 5 days) - she prefers to stay on current meds instead of starting anything new due to previous issue with pancreatitis  - continue amlodipine , spironolactone  - BMET 05/21/24 reviewed: sodium 143, potassium 3.2, creatinine 0.72, GFR >60 - BMET today  SDOH - Housing is a significant difficulty - social worker J Uris talked to patient while in office.    Return in 1 month, sooner if needed.   I spent 20 minutes reviewing records, interviewing/ examing patient and managing plan/ orders.    Ellouise Class, FNP-C 09/27/24

## 2024-09-28 ENCOUNTER — Ambulatory Visit: Admitting: Family

## 2024-09-28 ENCOUNTER — Telehealth: Payer: Self-pay | Admitting: Family

## 2024-09-28 NOTE — Telephone Encounter (Signed)
 Patient did not show for her Heart Failure Clinic appointment on 09/28/24.

## 2024-09-30 ENCOUNTER — Other Ambulatory Visit: Payer: Self-pay

## 2024-10-01 ENCOUNTER — Other Ambulatory Visit: Payer: Self-pay | Admitting: Family

## 2024-10-01 ENCOUNTER — Other Ambulatory Visit: Payer: Self-pay

## 2024-10-02 ENCOUNTER — Other Ambulatory Visit: Payer: Self-pay

## 2024-10-02 MED ORDER — ETODOLAC 500 MG PO TABS
500.0000 mg | ORAL_TABLET | Freq: Two times a day (BID) | ORAL | 0 refills | Status: AC
  Filled 2024-10-02: qty 14, 7d supply, fill #0

## 2024-10-02 MED FILL — Cyclobenzaprine HCl Tab 5 MG: ORAL | 3 days supply | Qty: 7 | Fill #0 | Status: AC

## 2024-10-05 ENCOUNTER — Other Ambulatory Visit: Payer: Self-pay

## 2024-10-27 ENCOUNTER — Other Ambulatory Visit: Payer: Self-pay

## 2024-10-27 ENCOUNTER — Other Ambulatory Visit: Payer: Self-pay | Admitting: Family

## 2024-10-28 ENCOUNTER — Other Ambulatory Visit: Payer: Self-pay | Admitting: Cardiology

## 2024-10-28 ENCOUNTER — Telehealth: Payer: Self-pay | Admitting: Family

## 2024-10-28 ENCOUNTER — Other Ambulatory Visit: Payer: Self-pay | Admitting: Family

## 2024-10-28 ENCOUNTER — Other Ambulatory Visit: Payer: Self-pay

## 2024-10-28 NOTE — Telephone Encounter (Signed)
 Called to r/s appt from 12/1

## 2024-11-05 ENCOUNTER — Ambulatory Visit: Admitting: Family Medicine
